# Patient Record
Sex: Female | Born: 1975 | Race: White | Hispanic: No | Marital: Married | Smoking: Never smoker
Health system: Southern US, Community
[De-identification: ages and names within clinical notes are randomized; demographics above are authoritative.]

## PROBLEM LIST (undated history)

## (undated) DIAGNOSIS — G40909 Epilepsy, unspecified, not intractable, without status epilepticus: Secondary | ICD-10-CM

## (undated) HISTORY — DX: Epilepsy, unspecified, not intractable, without status epilepticus: G40.909

---

## 2007-12-29 ENCOUNTER — Inpatient Hospital Stay (HOSPITAL_COMMUNITY): Admission: RE | Admit: 2007-12-29 | Discharge: 2007-12-31 | Payer: Self-pay | Admitting: Obstetrics and Gynecology

## 2008-01-02 ENCOUNTER — Encounter: Payer: Self-pay | Admitting: Obstetrics and Gynecology

## 2008-01-02 ENCOUNTER — Inpatient Hospital Stay (HOSPITAL_COMMUNITY): Admission: EM | Admit: 2008-01-02 | Discharge: 2008-01-03 | Payer: Self-pay | Admitting: Pediatrics

## 2008-05-30 ENCOUNTER — Ambulatory Visit (HOSPITAL_COMMUNITY): Admission: RE | Admit: 2008-05-30 | Discharge: 2008-05-30 | Payer: Self-pay | Admitting: Chiropractic Medicine

## 2009-12-14 ENCOUNTER — Inpatient Hospital Stay (HOSPITAL_COMMUNITY): Admission: AD | Admit: 2009-12-14 | Discharge: 2009-12-15 | Payer: Self-pay | Admitting: Obstetrics and Gynecology

## 2010-03-30 ENCOUNTER — Emergency Department (HOSPITAL_COMMUNITY): Admission: EM | Admit: 2010-03-30 | Discharge: 2010-03-31 | Payer: Self-pay | Admitting: Emergency Medicine

## 2010-04-08 ENCOUNTER — Encounter: Admission: RE | Admit: 2010-04-08 | Discharge: 2010-04-08 | Payer: Self-pay | Admitting: Family Medicine

## 2010-04-08 ENCOUNTER — Encounter: Payer: Self-pay | Admitting: Internal Medicine

## 2010-04-18 ENCOUNTER — Ambulatory Visit: Payer: Self-pay | Admitting: Internal Medicine

## 2010-04-18 DIAGNOSIS — R569 Unspecified convulsions: Secondary | ICD-10-CM | POA: Insufficient documentation

## 2010-04-18 DIAGNOSIS — R930 Abnormal findings on diagnostic imaging of skull and head, not elsewhere classified: Secondary | ICD-10-CM | POA: Insufficient documentation

## 2010-04-22 ENCOUNTER — Ambulatory Visit: Payer: Self-pay | Admitting: Internal Medicine

## 2010-05-16 ENCOUNTER — Encounter: Payer: Self-pay | Admitting: Internal Medicine

## 2010-06-07 ENCOUNTER — Ambulatory Visit: Payer: Self-pay | Admitting: Internal Medicine

## 2010-06-07 DIAGNOSIS — J129 Viral pneumonia, unspecified: Secondary | ICD-10-CM | POA: Insufficient documentation

## 2010-06-10 ENCOUNTER — Telehealth (INDEPENDENT_AMBULATORY_CARE_PROVIDER_SITE_OTHER): Payer: Self-pay | Admitting: *Deleted

## 2010-07-30 NOTE — Assessment & Plan Note (Signed)
Summary: New / Bcbs / # cd   Vital Signs:  Patient profile:   35 year old female Menstrual status:  regular LMP:     04/06/2010 Height:      63 inches Weight:      137.25 pounds BMI:     24.40 O2 Sat:      97 % on Room air Temp:     98.0 degrees F oral Pulse rate:   93 / minute Pulse rhythm:   regular Resp:     16 per minute BP sitting:   126 / 88  (left arm) Cuff size:   regular  Vitals Entered By: Rock Nephew CMA (April 18, 2010 1:18 PM)  O2 Flow:  Room air  Primary Care Tieisha Darden:  Etta Grandchild MD   History of Present Illness: New to me this [place] female had a CT scan done 0n 10/10 that is suspicious for sarcoid. She has been seeing Dr. Cleta Alberts for URI symptoms and was found to have an abnormal Chest xray and a CT was ordered that shows findings c/w sarcoid (see scanned document). She says that Dr. Cleta Alberts did blood work that was negative for sarcoid but that her WBC count was elevated and she was placed on antibiotics ( a copy of those labs is not available to me today). She still has some SOB and NP cough.  Preventive Screening-Counseling & Management  Alcohol-Tobacco     Alcohol drinks/day: 0     Smoking Status: never     Tobacco Counseling: not indicated; no tobacco use  Caffeine-Diet-Exercise     Does Patient Exercise: yes  Hep-HIV-STD-Contraception     Hepatitis Risk: no risk noted     HIV Risk: no risk noted     STD Risk: no risk noted      Sexual History:  currently monogamous.        Drug Use:  no.        Blood Transfusions:  no.    Current Medications (verified): 1)  Lamictal 100 Mg Tabs (Lamotrigine) .... 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 at [employer] Married Never Smoked Alcohol use-no Drug use-no Regular exercise-yes Smoking Status:  never Hepatitis Risk:  no risk noted HIV Risk:  no risk noted STD Risk:  no risk noted Sexual History:  currently monogamous Blood Transfusions:  no Drug Use:  no Does Patient Exercise:  yes  Review of Systems       The patient complains of prolonged cough.  The patient denies anorexia, fever, weight loss, weight gain, chest pain, syncope, dyspnea on exertion, peripheral edema, headaches, hemoptysis, abdominal pain, suspicious skin lesions, difficulty walking, depression, abnormal bleeding, enlarged lymph nodes, and angioedema.   General:  Denies chills, fatigue, fever, loss of appetite, malaise, sleep disorder, and sweats. Resp:  Complains of chest discomfort and cough; denies chest pain with inspiration, coughing up blood, hypersomnolence, morning headaches, pleuritic, shortness of breath, sputum productive, and wheezing. Derm:  Denies dryness, flushing, itching, lesion(s), and rash.  Physical Exam  General:  alert, well-developed, well-nourished, well-hydrated, appropriate dress, normal appearance, and healthy-appearing.   Head:  normocephalic, atraumatic, no abnormalities observed, and no abnormalities palpated.   Eyes:  vision grossly intact, pupils equal, pupils round, and pupils reactive to light.   Mouth:  Oral mucosa and oropharynx without lesions or exudates.  Teeth in good repair. Neck:  supple, full ROM, no masses, no thyromegaly, no thyroid nodules or tenderness, no JVD, normal carotid upstroke, no carotid bruits, no cervical lymphadenopathy, and no neck tenderness.   Lungs:  normal respiratory effort, no intercostal retractions, no accessory muscle use, normal breath sounds, no dullness, no fremitus, no crackles, and no wheezes.   Heart:   normal rate, regular rhythm, no murmur, no gallop, no rub, and no JVD.   Abdomen:  soft, non-tender, normal bowel sounds, no distention, no masses, no guarding, no rigidity, no rebound tenderness, no abdominal hernia, no inguinal hernia, no hepatomegaly, and no splenomegaly.   Msk:  No deformity or scoliosis noted of thoracic or lumbar spine.   Pulses:  R and L carotid,radial,femoral,dorsalis pedis and posterior tibial pulses are full and equal bilaterally Extremities:  No clubbing, cyanosis, edema, or deformity noted with normal full range of motion of all joints.   Neurologic:  No cranial nerve deficits noted. Station and gait are normal. Plantar reflexes are down-going bilaterally. DTRs are symmetrical throughout. Sensory, motor and coordinative functions appear intact. Skin:  turgor normal, color normal, no rashes, no suspicious lesions, no ecchymoses, no petechiae, no purpura, no ulcerations, and no edema.   Cervical Nodes:  no anterior cervical adenopathy and no posterior cervical adenopathy.   Axillary Nodes:  no R axillary adenopathy and no L axillary adenopathy.   Psych:  Cognition and judgment appear intact. Alert and cooperative with normal attention span and concentration. No apparent delusions, illusions, hallucinations   Impression & Recommendations:  Problem # 1:  ABNORMAL CHEST XRAY (ICD-793.1) Assessment New this looks like sarcoid but the report of elevated WBC count raises concerns about lymphoma so I think she will need to have a lung biopsy done Orders: Pulmonary Referral (Pulmonary)  Complete Medication List: 1)  Lamictal 100 Mg Tabs (Lamotrigine) .... 50mg  am and 100mg  evening 2)  Albuterol Inhal   Patient Instructions: 1)  Please schedule a follow-up appointment in 1 month. 2)  If you are having sex and you or your partner don't want a child, use contraception.   Orders Added: 1)  Pulmonary Referral [Pulmonary] 2)  New Patient Level III [99203]   Immunization  History:  Influenza Immunization History:    Influenza:  state fluvax nasal (02/28/2010)   Immunization History:  Influenza Immunization History:    Influenza:  State Fluvax Nasal (02/28/2010)  Preventive Care Screening  Pap Smear:    Date:  01/28/2010    Results:  normal

## 2010-07-30 NOTE — Letter (Signed)
Summary: Pulmonary/[facility]  Pulmonary/[facility]   Imported By: Sherian Rein 05/22/2010 10:26:31  _____________________________________________________________________  External Attachment:    Type:   Image     Comment:   External Document

## 2010-07-30 NOTE — Assessment & Plan Note (Signed)
Summary: ABNORMAL CXR ///KP   Primary Provider/Referring Provider:  Etta Grandchild MD  CC:  Pulmonary Consult-Dr. Iona Coach CXR/CT.Marland Kitchen  History of Present Illness: April 22, 2010- 34 yoF, originally from [place], seen at request of Dr Yetta Barre because of abnormal radiology. Never smoker had seen Dr Lucie Leather 2 years ago for rhinitis without asthma. 1 month ago acutely ill with flu-like syndrome, ear ache, sore throat then cough. Initially some fever. Saw Dr Cleta Alberts at [facility] and CXR showed prominent nodes. Rx'd antibiotic. Began noting substernal soreness and CT on 04/08/10 showed mild mediastinal and hilar adenopathy with some scattered ground glass haziness. Radiologist suggested possible Sarcoid. Outside labs not yet available indicated some leukocytosis. ACE Neg. Mild dyspnea at most, w/ rash, hot joints or palpable adenopathy. No prior CXR. The cough and sore throat are now gone. Substernal soreness began w/ the cough and is now a little better, but fluctuates.  Denies prior hx of pneumonia, asthma or TB exposure.  Past hx seizures- once at age 13 and twice in 2009 after pregnancy.   Preventive Screening-Counseling & Management  Alcohol-Tobacco     Smoking Status: never  Current Medications (verified): 1)  Lamictal 100 Mg Tabs (Lamotrigine) .... 50mg  Two Times A Day 2)  Proair Hfa 108 (90 Base) Mcg/act Aers (Albuterol Sulfate) .... 2 Puffs Four Times A Day As Needed 3)  Advil 200 Mg Tabs (Ibuprofen) .... 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  Smoked Alcohol use-no Drug use-no Regular exercise-yes  Risk Factors: Alcohol Use: 0 (04/18/2010) Exercise: yes (04/18/2010)  Risk Factors: Smoking Status: never (04/22/2010)  Past Medical History: Seizure disorder in 2009 -post partum, she says her brain MRI was normal and no recent seizures ([place]) Abnormal Chest CT- 2011  Family History: Family History High cholesterol Family History Hypertension Family History of Stroke F 1st degree relative <60 Parents living  Social History: 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  The patient denies productive cough, coughing up blood, irregular heartbeats, acid heartburn, indigestion, loss of appetite, abdominal pain, difficulty swallowing, tooth/dental problems, headaches, nasal congestion/difficulty breathing through nose, sneezing, itching, ear ache, depression, hand/feet swelling, joint stiffness or pain, rash, change in color of mucus, and fever.    Vital Signs:  Patient profile:   35 year old female Menstrual status:  regular Height:      63 inches Weight:      136.25 pounds BMI:     24.22 O2 Sat:      98 % on Room air Pulse rate:   115 / minute BP sitting:   130 / 84  (left arm) Cuff size:   regular  Vitals Entered By: Reynaldo Minium CMA (April 22, 2010 1:35 PM)  O2 Flow:  Room air  Physical Exam  Additional Exam:  General: A/Ox3; pleasant and cooperative, NAD, wdwn SKIN: no rash, lesions NODES: no  lymphadenopathy HEENT: [place]/AT, EOM- WNL, Conjuctivae- clear, PERRLA, TM-WNL, Nose- clear, Throat- clear and wnl, Mallampati  II NECK: Supple w/ fair ROM, JVD- none, normal carotid impulses w/o bruits Thyroid- normal to  palpation CHEST: Clear to P&A HEART: RRR, no m/g/r heard ABDOMEN: Soft and nl; nml bowel sounds; no organomegaly or masses noted. ZOX:WRUE, nl pulses, no edema  NEURO: Grossly intact to observation      Impression & Recommendations:  Problem # 1:  ABNORMAL CHEST XRAY (ICD-793.1)  3 weeks of substernal chest discomfort, mediastinal adenopathy, ground glass w/ a bronchitis syndrome. My first thought would be a postviral bronchitis w/ reactive adenopathy, because it began with fever, sore throat and earache, then cough. . DDX is next sarcoid, which sometimes seems to start with an inflammatory trigger.  Then less likely lymphoproliferative disease/ lymphoma.  I suggested we treat for comfort for 3 weeks, then get her back for CXR and labs. If this were an acute inflammatory response I would expect to see improvement  by then. Otherwise we will need to biopsy. I also wanted time for outside labs that reportedly have arrived downstairs, to be scanned in.   Medications Added to Medication List This Visit: 1)  Lamictal 100 Mg Tabs (Lamotrigine) .... 50mg  two times a day 2)  Proair Hfa 108 (90 Base) Mcg/act Aers (Albuterol sulfate) .... 2 puffs four times a day as needed 3)  Advil 200 Mg Tabs (Ibuprofen) .... Take 2 by mouth two times a day  Other Orders: Consultation Level IV (45409)  Patient Instructions: 1)  Please schedule a follow-up appointment in 3 weeks. 2)  Please call sooner if you need.  3)  cc Dr Yetta Barre, Dr Cleta Alberts

## 2010-08-01 NOTE — Progress Notes (Signed)
Summary: results  Phone Note Call from Patient Call back at Home Phone [phone]   Caller: Patient Call For: young Summary of Call: pt wants results of cxr.  Initial call taken by: Tivis Ringer, CNA,  June 10, 2010 9:58 AM  Follow-up for Phone Call        Spoke with pt aware cxr normal per Dr.Young note. Follow-up by: Jerolyn Shin,  June 10, 2010 11:54 AM

## 2010-08-01 NOTE — Assessment & Plan Note (Signed)
Summary: rov/jd   Primary Provider/Referring Provider:  Etta Grandchild MD  CC:  Follow up visit-tightness in chest getting better but not back to normal..  History of Present Illness: CC:  Pulmonary Consult-Dr. Iona Coach CXR/CT.Marland Kitchen  History of Present Illness: April 22, 2010- 35 yoF, originally from [place], seen at request of Dr Yetta Barre because of abnormal radiology. Never smoker had seen Dr Lucie Leather 2 years ago for rhinitis without asthma. 1 month ago acutely ill with flu-like syndrome, ear ache, sore throat then cough. Initially some fever. Saw Dr Cleta Alberts at [facility] and CXR showed prominent nodes. Rx'd antibiotic. Began noting substernal soreness and CT on 04/08/10 showed mild mediastinal and hilar adenopathy with some scattered ground glass haziness. Radiologist suggested possible Sarcoid. Outside labs not yet available indicated some leukocytosis. ACE Neg. Mild dyspnea at most, w/ rash, hot joints or palpable adenopathy. No prior CXR. The cough and sore throat are now gone. Substernal soreness began w/ the cough and is now a little better, but fluctuates.  Denies prior hx of pneumonia, asthma or TB exposure.  Past hx seizures- once at age 27 and twice in 2009 after pregnancy.   June 07, 2010- Pneumonia with adenopathy. Nurse-CC: CC: Follow up visit-tightness in chest getting better but not back to normal. "Better" means less aware of chest tightness associated with exertion or wet/ cold weather. Gradually better each week. Denies cough or wheeze, night swets or nodes. .  CT had originally shown ground glass and adenopathy surggestive of sarcoid.    Preventive Screening-Counseling & Management  Alcohol-Tobacco     Alcohol drinks/day: 0     Smoking Status: never     Tobacco Counseling: not indicated; no tobacco use  Current Medications (verified): 1)  Proair Hfa 108 (90 Base) Mcg/act Aers (Albuterol Sulfate) .... 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[employer], 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    Vital Signs:  Patient profile:   35 year old female Menstrual status:  regular Height:      63 inches Weight:      138.25 pounds BMI:     24.58 O2 Sat:      100 % on Room air Pulse rate:   95 / minute BP sitting:   120 / 74  (left arm) Cuff size:   regular  Vitals Entered By: Reynaldo Minium CMA (June 07, 2010 3:30 PM)  O2 Flow:  Room air CC: Follow up visit-tightness in chest getting better but not back to normal.   Physical  Exam  Additional Exam:  General: A/Ox3; pleasant and cooperative, NAD, wdwn SKIN: no rash, lesions NODES: no lymphadenopathy HEENT: [place]/AT, EOM- WNL, Conjuctivae- clear, PERRLA, TM-WNL, Nose- clear, Throat- clear and wnl, Mallampati  II NECK: Supple w/ fair ROM, JVD-  none, normal carotid impulses w/o bruits Thyroid- normal to palpation CHEST: Clear to P&A HEART: RRR, no m/g/r heard ABDOMEN: Not obese ZOX:WRUE, nl pulses, no edema  NEURO: Grossly intact to observation      Impression & Recommendations:  Problem # 1:  ABNORMAL CHEST XRAY (ICD-793.1)  Symptomatically she is improved and I think the course is consistent with impression that she has had a viral pattern pneumonia. We will recheck CXR, and if improving to support her impression, we should be out of the woods.   Other Orders: Est. Patient Level IV (99214) T-2 View CXR (71020TC)  Patient Instructions: 1)  Please schedule a follow-up appointment as needed. 2)  A chest x-ray has been recommended.  Your imaging study may require preauthorization.

## 2010-09-15 LAB — CBC
HCT: 35.2 % — ABNORMAL LOW (ref 36.0–46.0)
Hemoglobin: 12.2 g/dL (ref 12.0–15.0)
Hemoglobin: 13.1 g/dL (ref 12.0–15.0)
MCHC: 34.6 g/dL (ref 30.0–36.0)
MCV: 96.5 fL (ref 78.0–100.0)
MCV: 97.4 fL (ref 78.0–100.0)
Platelets: 191 10*3/uL (ref 150–400)
RBC: 3.93 MIL/uL (ref 3.87–5.11)
RDW: 13.6 % (ref 11.5–15.5)

## 2010-11-12 NOTE — Discharge Summary (Signed)
Michele Mills, Michele Mills               ACCOUNT NO.:  [id number]   MEDICAL RECORD NO.:  [id number]          PATIENT TYPE:  INP   LOCATION:  9110                          FACILITY:  [facility]   PHYSICIAN:  Sherron Monday, MD        DATE OF BIRTH:  03/06/1976   DATE OF ADMISSION:  12/29/2007  DATE OF DISCHARGE:  12/31/2007                               DISCHARGE SUMMARY   ADMITTING DIAGNOSIS:  Intrauterine pregnancy at term with IUGR with an  estimated fetal weight of less than 10th percentile.   DISCHARGE DIAGNOSIS:  Intrauterine pregnancy at term IUGR with an  estimated fetal weight of less than 10th percentile, delivered via  spontaneous vaginal delivery.   HISTORY OF PRESENT ILLNESS:  A 35 year old G1 P0, at 56 plus weeks.  EDC  of January 04, 2008 by last menstrual period consistent with 7-week  ultrasound presents to L&D for induction given on ultrasound on December 27, 2007 showing IUGR with an estimated fetal weight of less than 10th  percentile with normal AFI and a reactive indices.  Prenatal care is  otherwise uncomplicated.   PAST MEDICAL HISTORY:  Nonsignificant.   PAST SURGICAL HISTORY:  Nonsignificant.   PAST OB/GYN HISTORY:  She is a G1 with the present pregnancy with a  history of an abnormal Pap smear, which has resolved.   MEDICATION:  Include prenatal vitamins.   ALLERGIES:  No known drug allergies.   SOCIAL HISTORY:  Denies alcohol, tobacco, or drug use.  She is married.   PRENATAL LABS:  A positive.  MSU negative.  Rubella immune.  Hepatitis C  surface antigen negative.  HIV negative.  Gonorrhea negative.  Chlamydia  negative.  Group B strep negative.  First-hour screen within limits.  A  1-hour Glucola is 72.   PHYSICAL EXAMINATION:  On admission, she is afebrile.  Vital signs  stable.  Fetal heart rate tones are reactive with occasional contractions and a  benign exam.  Her cervix is found to be 1 to 1+ cm dilated, 50% effaced,  -2 station.   On admission, she had an  AROM performed for clear fluid and started on  Pitocin for induction to monitor closely throughout her labor progress  and IUPC was placed.  She labored down and got uncomfortable.  Pushed  well from complete, complete, +2, for approximately 10 minutes with a  normal delivery of a vigorous female over a small first-degree  laceration with Apgars of 8 at 1 minutes and 9 at 5 minutes and total  weight of 6 pounds 8 ounces.  Placenta was delivered intact  spontaneously.  A small laceration was repaired with 2-0 Vicryl for  hemostasis.  Postpartum course was relatively uncomplicated.  She  remained afebrile.  Vital signs stable throughout.  Complained of some  soreness, which was within normal limits.  She was discharged to home.  Postpartum day #2  with routine discharge instructions and numbers to call if any questions  or problems as well as prescription for Motrin, Vicodin, and prenatal  vitamins.  She plans to bottle feed and will  follow up in 6 weeks to  discuss contraception.  Her hemoglobin decreased from 14.1 to 12.3.      Sherron Monday, MD  Electronically Signed     JB/MEDQ  D:  12/31/2007  T:  12/31/2007  Job:  [id number]

## 2010-11-12 NOTE — Discharge Summary (Signed)
NAMENOMI, RUDNICKI NO.:  [id number]   MEDICAL RECORD NO.:  [id number]          PATIENT TYPE:  INP   LOCATION:  3008                         FACILITY:  [facility]   PHYSICIAN:  Deanna Artis. Hickling, M.D.DATE OF BIRTH:  [birth year]   DATE OF ADMISSION:  01/02/2008  DATE OF DISCHARGE:  01/03/2008                               DISCHARGE SUMMARY   FINAL DIAGNOSES:  New onset of seizures, 345.10.   PROCEDURES:  CT scan of the brain, MRI scan of the brain, and EEG.   COMPLICATIONS:  None.   SUMMARY OF THE HOSPITALIZATION:  Ms. Scarbro is a 35 year old  primigravida, now 5 days postpartum, who had onset of 2 seizures in the  early morning hours of January 02, 2008.  These are generalized in nature  without focal onset and with postictal changes.   The patient was admitted to [facility] for evaluation and  treatment.   We loaded her with Cerebyx and treated with maintenance Dilantin.  We  also started Lamictal with the intent to crossover to this drug because  of its safety profile for women with  childbearing years.   The MRI scan of the brain without and with contrast was normal.  EEG was  normal.  The patient's laboratory studies failed to show any signs of  changes related to toxemia.   PHYSICAL EXAMINATION:  On examination this morning, temperature 98.0,  blood pressure 126/84, resting pulse 74, and oxygen saturation 96%.  General physical examination is normal.  Neurological examination is  normal and is recorded on the chart.   PLAN:  1. The patient will be discharged home at this time.  She will be sent      home on Dilantin 300 mg nightly for 6 weeks time.  This will be      discontinued at the end of that time.  2. The patient will start on Lamictal.  We have discussed lamotrigine      versus trial of Lamictal and the family has decided to go with the      latter.  She will start on 25 mg twice daily for 2 weeks, 50 mg      twice daily for 2 weeks, 75  mg twice daily for 1 week, and then      switch over to 100 mg tablets from 25 mg tablets and take 1 twice      daily.  Prescriptions have been issued for Dilantin 100 mg tablets,      #126; Lamictal 25 mg tablets, #126; and Lamictal 100 mg tablets,      #62, only the latter with 5 refills.   The patient will have CBC with diff checked in 2 weeks, 4 weeks, and 6  weeks and a trial of lamotrigine level in 7 weeks' time.  We will  titrate her lamotrigine level from there and observe her response to  antiepileptic medication.   The patient is not to drive until she has been seizure-free for 6  months.  The family understands the importance of this.  I have  discussed benefits  and side effects of the medications, and the need for  medical compliance.  Plans for follow-up in October 2009.  No further  workup is needed at this time.  The patient has tolerated the medicines  well, and is discharged in improved condition.      Deanna Artis. Sharene Skeans, M.D.  Electronically Signed     [facility]/MEDQ  D:  01/03/2008  T:  01/04/2008  Job:  [id number]

## 2010-11-12 NOTE — H&P (Signed)
Michele Mills, BARDSLEY NO.:  [id number]   MEDICAL RECORD NO.:  [id number]          PATIENT TYPE:  INP   LOCATION:  3008                         FACILITY:  [facility]   PHYSICIAN:  Deanna Artis. Hickling, M.D.DATE OF BIRTH:  09/07/1975   DATE OF ADMISSION:  01/02/2008  DATE OF DISCHARGE:                              HISTORY & PHYSICAL   CHIEF COMPLAINT:  Recurrent seizures.   HISTORY OF PRESENT ILLNESS:  A 35 year old right-handed, G1, P1 woman  now 4 days postpartum without complicated pregnancy.   The patient had onset around 2:30 this morning of a 3 to 7-minute  generalized tonic-clonic seizure with cyanosis and brief apnea and a 40-  minute postictal.  The patient was transferred to [facility] after  EMS evaluated her for assessment.  She had recurrent 2-minute  generalized tonic-clonic seizure with duskiness and a 40-minute  postictal period around 6:50 a.m.  The patient is to be admitted for  diagnostic workup, initiation of treatment, and observation.   The patient was loaded with Cerebyx 1200 mg phenytoin equivalents  (estimated weight 150 pounds).  She feels trag, but otherwise is fine.   PAST MEDICAL HISTORY:  Single seizure in [place] at age 29.  She said  she was very hot and dehydrated.  I lost 2 days.  She awakened in the  hospital.  Workup by the patient's report was negative.  She was not  placed on medications.   The only other medical problem that she had was abnormal Pap smear,  which apparently has normalized.   PAST SURGICAL HISTORY:  None.   REVIEW OF SYSTEMS:  The patient complains of tenderness in the axillary  regions bilaterally in all cerebral.  I believe this is related to  engorgement.  She has some postpartum perineal pain, fever, nausea,  vomiting, diarrhea, cough, rash, no other organ systems symptoms in a 12-  system review.   MEDICATIONS:  1. Dilantin, she will receive 300 mg at bedtime as a maintenance dose.  2. Lamictal  25 mg twice a day will be started and will be gradually      introduced over the next 6 weeks.  3. She will receive ibuprofen 800 mg for pain every 6 hours as needed.  4. Prenatal vitamins.   DRUG ALLERGIES:  None known.   FAMILY HISTORY:  Mother had 2 strokes in her 84s, has hypertension, and  had anniversary seizures.   The paternal grandmother had cancer of the stomach.  No other history of  seizures, mental retardation, cerebral palsy, blindness, or deafness.   PHYSICAL EXAMINATION:  VITAL SIGNS:  Blood pressure 105/70, resting  pulse 70, temperature 96, and oxygen saturation 97%.  HEENT:  Ears, nose, and throat; no infections, no bruits, or bitten  tongue.  LUNGS:  Clear.  HEART:  No murmurs.  Pulses are normal.  ABDOMEN:  Soft.  Bowel sounds are normal.  No hepatosplenomegaly.  EXTREMITIES:  Unremarkable.  NEUROLOGIC:  Mental status; awake and alert without dysphasia.  Cranial  nerves are round and reactive pupils.  Visual fields full to double  simultaneous stimuli.  Symmetric facial strength.  Midline tongue and  uvula.  Air conduction greater than bone conduction bilaterally.  MOTOR:  Normal strength, tone, and mass.  Good fine motor movements.  No  pronator drift.  Sensation intact to primary and cortical modalities.  CEREBELLAR:  Good finger-to-nose, rapid alternating movements.  Gait was  not tested.  Deep tendon reflexes were brisk without clonus.  She had  bilateral flexor plantar responses.   IMPRESSION:  1. Recurrent seizures 345.10.  I do not know if these are partial      onset that there seems to be no partial onset that we can see.  2. The patient is recently postpartum.   PLAN:  An EEG on January 03, 2008.  MRI of the brain without with contrast,  today or on January 03, 2008, dilantin and Lamictal treatment as noted  above.   I have reviewed her CT scan, which shows a large cisterna magna, mild  vermian hypoplasia, ventricles at top limits are normal and  normal  cortex.   LABORATORY STUDIES:  White blood cell count 11,400, hemoglobin 12.4,  hematocrit 36.2, MCV 95.1, and platelet count 280,000.  Sodium 136,  potassium 3.6, chloride 102, carbon dioxide 26, glucose 94, BUN 8,  creatinine 0.56, calcium 8.6, total protein 5.7, and albumin 2.6.  AST  29, LDH 198, and uric acid 4.6.  ALT 35, alkaline phosphatase 70, total  bilirubin 0.5, GFR greater than 60.  Urinalysis; specific gravity 1.015,  pH 7.0.  Negative chemistries and negative microscopic.      Deanna Artis. Sharene Skeans, M.D.  Electronically Signed     [facility]  D:  01/02/2008  T:  01/02/2008  Job:  [id number]   cc:   Sherron Monday, MD

## 2010-11-12 NOTE — Procedures (Signed)
CLINICAL HISTORY:  The patient is a 35 year old who is 4 days of  delivery of a 39-week gestational age infant without complications.  The  patient had two episode of seizures on the morning of January 02, 2008.  She  had a previous seizure 10 years ago.  Study is being done to look for  presence of seizure disorder (780.39).   PROCEDURE:  Tracing is carried out on a 32-channel digital Cadwell  recorder reformatted into 16 channel montages with one devoted to EKG.  The patient was awake and drowsy during the recording.  Medications  include Dilantin, Lamictal, and ibuprofen.  The [employer] 10/20  system lead placement was used.   DESCRIPTION OF FINDINGS:  Dominant frequency is a 15 microvolt 9-10 Hz  activity that is well regulated.  Background activity is predominantly  alpha and beta range activity.  Occasional upper theta range components  were seen.  Toward the end of the activity, the patient becomes drowsy  with mixed frequency theta upper delta range activity and briefly drifts  into natural sleep with vertex sharp waves.   Activating procedures with hyperventilation caused little change in  background.  Photic stimulation induced a driving response from 5 to 11  Hz.   There was no interictal epileptiform activity in the form of spikes or  sharp waves.   EKG showed regular sinus rhythm with ventricular response of 78 beats  per minute.   IMPRESSION:  Normal record with the patient awake, drowsy, and asleep.      Deanna Artis. Sharene Skeans, M.D.  Electronically Signed     [facility]:WCBJ  D:  01/03/2008 15:08:56  T:  01/04/2008 62:83:15  Job #:  [id number]

## 2011-03-27 LAB — COMPREHENSIVE METABOLIC PANEL
ALT: 35
AST: 29
Albumin: 2.6 — ABNORMAL LOW
Alkaline Phosphatase: 70
CO2: 26
Chloride: 102
Creatinine, Ser: 0.56
GFR calc Af Amer: >60
GFR calc non Af Amer: >60
Potassium: 3.6
Sodium: 136
Total Bilirubin: 0.5

## 2011-03-27 LAB — RAPID URINE DRUG SCREEN, HOSP PERFORMED
Amphetamines: NOT DETECTED
Barbiturates: NOT DETECTED
Benzodiazepines: NOT DETECTED
Opiates: NOT DETECTED

## 2011-03-27 LAB — URINALYSIS, ROUTINE W REFLEX MICROSCOPIC
Bilirubin Urine: NEGATIVE
Glucose, UA: NEGATIVE
Ketones, ur: NEGATIVE
Leukocytes, UA: NEGATIVE
Protein, ur: NEGATIVE
pH: 7

## 2011-03-27 LAB — CBC
HCT: 41
MCHC: 34.2
MCHC: 34.4
MCV: 95.1
Platelets: 246
Platelets: 280
Platelets: 287
RBC: 3.74 — ABNORMAL LOW
RBC: 3.81 — ABNORMAL LOW
RDW: 13.6
WBC: 11.4 — ABNORMAL HIGH

## 2011-03-27 LAB — URINE MICROSCOPIC-ADD ON

## 2011-04-28 ENCOUNTER — Ambulatory Visit (INDEPENDENT_AMBULATORY_CARE_PROVIDER_SITE_OTHER): Payer: Federal, State, Local not specified - PPO | Admitting: Internal Medicine

## 2011-04-28 ENCOUNTER — Encounter: Payer: Self-pay | Admitting: Internal Medicine

## 2011-04-28 VITALS — BP 122/82 | HR 91 | Temp 98.1°F

## 2011-04-28 DIAGNOSIS — H6690 Otitis media, unspecified, unspecified ear: Secondary | ICD-10-CM

## 2011-04-28 MED ORDER — AMOXICILLIN-POT CLAVULANATE 875-125 MG PO TABS: 1.0000 | ORAL_TABLET | Freq: Two times a day (BID) | ORAL | Status: AC

## 2011-04-28 NOTE — Patient Instructions (Signed)
It was good to see you today. Augmentin antibiotics for your ear infection- Your prescription(s) have been submitted to your pharmacy. Please take as directed and contact our office if you believe you are having problem(s) with the medication(s). Continue ibuprofen for pain as ongoing

## 2011-04-28 NOTE — Progress Notes (Signed)
  Subjective:    Patient ID: Michele Mills, female    DOB: 11-Dec-1975, 35 y.o.   MRN: [id number]  Otalgia  There is pain in the left ear. This is a new problem. The current episode started 1 to 4 weeks ago. The problem occurs constantly. The problem has been gradually worsening. The maximum temperature recorded prior to her arrival was 100 - 100.9 F. The fever has been present for 1 to 2 days. The pain is mild. Pertinent negatives include no ear discharge, headaches, hearing loss or neck pain. The treatment provided moderate relief.   Past Medical History  Diagnosis Date  . Seizure disorder     Review of Systems  HENT: Positive for ear pain. Negative for hearing loss, neck pain and ear discharge.   Neurological: Negative for headaches.       Objective:   Physical Exam BP 122/82  Pulse 91  Temp(Src) 98.1 F (36.7 C) (Oral)  SpO2 99% Constitutional: She appears well-developed and well-nourished. No distress.  HENT: Head: Normocephalic and atraumatic. Ears: R TMs ok, no erythema or effusion; L TM with erythema and hazy effusion. Nose: Nose normal.  Mouth/Throat: Oropharynx is clear and moist. No oropharyngeal exudate.  Neck: Normal range of motion. Neck supple. No JVD present. No thyromegaly present.  Cardiovascular: Normal rate, regular rhythm and normal heart sounds.  No murmur heard. No BLE edema. Pulmonary/Chest: Effort normal and breath sounds normal. No respiratory distress. She has no wheezes. Psychiatric: She has a normal mood and affect. Her behavior is normal. Judgment and thought content normal.   Lab Results  Component Value Date   WBC 14.4* 12/15/2009   HGB 12.2 12/15/2009   HCT 35.2* 12/15/2009   PLT 191 12/15/2009   GLUCOSE 94 01/02/2008   ALT 35 01/02/2008   AST 29 01/02/2008   NA 136 01/02/2008   K 3.6 01/02/2008   CL 102 01/02/2008   CREATININE 0.56 01/02/2008   BUN 8 01/02/2008   CO2 26 01/02/2008       Assessment & Plan:  L otitis media, sx ongoing > 2 weeks with  increasing pain and LGF at home - augmentin antibiotics  + continue nasal steroids + NSAIDs

## 2011-06-25 ENCOUNTER — Ambulatory Visit (INDEPENDENT_AMBULATORY_CARE_PROVIDER_SITE_OTHER): Payer: Federal, State, Local not specified - PPO | Admitting: Internal Medicine

## 2011-06-25 ENCOUNTER — Encounter: Payer: Self-pay | Admitting: Internal Medicine

## 2011-06-25 ENCOUNTER — Ambulatory Visit (INDEPENDENT_AMBULATORY_CARE_PROVIDER_SITE_OTHER)
Admission: RE | Admit: 2011-06-25 | Discharge: 2011-06-25 | Disposition: A | Discharge status: Home / Self Care | Payer: Federal, State, Local not specified - PPO | Source: Ambulatory Visit | Attending: Internal Medicine | Admitting: Internal Medicine

## 2011-06-25 VITALS — BP 116/80 | HR 84 | Temp 98.4°F | Resp 16 | Wt 130.0 lb

## 2011-06-25 DIAGNOSIS — J209 Acute bronchitis, unspecified: Secondary | ICD-10-CM

## 2011-06-25 DIAGNOSIS — H6690 Otitis media, unspecified, unspecified ear: Secondary | ICD-10-CM

## 2011-06-25 DIAGNOSIS — R05 Cough: Secondary | ICD-10-CM

## 2011-06-25 DIAGNOSIS — R059 Cough, unspecified: Secondary | ICD-10-CM

## 2011-06-25 MED ORDER — PSEUDOEPH-CHLORPHEN-HYDROCOD 60-4-5 MG/5ML PO SOLN: 5.0000 mL | Freq: Four times a day (QID) | ORAL | Status: DC | PRN

## 2011-06-25 MED ORDER — AMOXICILLIN-POT CLAVULANATE 500-125 MG PO TABS: 1.0000 | ORAL_TABLET | Freq: Three times a day (TID) | ORAL | Status: AC

## 2011-06-25 NOTE — Assessment & Plan Note (Signed)
Start augmentin and she was given pt ed material as well, I think the hydrocodone will help with the pain

## 2011-06-25 NOTE — Patient Instructions (Signed)
Acute Bronchitis You have acute bronchitis. This means you have a chest cold. The airways in your lungs are red and sore (inflamed). Acute means it is sudden onset.  CAUSES Bronchitis is most often caused by the same virus that causes a cold. SYMPTOMS   Body aches.   Chest congestion.   Chills.   Cough.   Fever.   Shortness of breath.   Sore throat.  TREATMENT  Acute bronchitis is usually treated with rest, fluids, and medicines for relief of fever or cough. Most symptoms should go away after a few days or a week. Increased fluids may help thin your secretions and will prevent dehydration. Your caregiver may give you an inhaler to improve your symptoms. The inhaler reduces shortness of breath and helps control cough. You can take over-the-counter pain relievers or cough medicine to decrease coughing, pain, or fever. A cool-air vaporizer may help thin bronchial secretions and make it easier to clear your chest. Antibiotics are usually not needed but can be prescribed if you smoke, are seriously ill, have chronic lung problems, are elderly, or you are at higher risk for developing complications.Allergies and asthma can make bronchitis worse. Repeated episodes of bronchitis may cause longstanding lung problems. Avoid smoking and secondhand smoke.Exposure to cigarette smoke or irritating chemicals will make bronchitis worse. If you are a cigarette smoker, consider using nicotine gum or skin patches to help control withdrawal symptoms. Quitting smoking will help your lungs heal faster. Recovery from bronchitis is often slow, but you should start feeling better after 2 to 3 days. Cough from bronchitis frequently lasts for 3 to 4 weeks. To prevent another bout of acute bronchitis:  Quit smoking.   Wash your hands frequently to get rid of viruses or use a hand sanitizer.   Avoid other people with cold or virus symptoms.   Try not to touch your hands to your mouth, nose, or eyes.  SEEK  IMMEDIATE MEDICAL CARE IF:  You develop increased fever, chills, or chest pain.   You have severe shortness of breath or bloody sputum.   You develop dehydration, fainting, repeated vomiting, or a severe headache.   You have no improvement after 1 week of treatment or you get worse.  MAKE SURE YOU:   Understand these instructions.   Will watch your condition.   Will get help right away if you are not doing well or get worse.  Document Released: 07/24/2004 Document Revised: 02/26/2011 Document Reviewed: 10/09/2010 [facility] Patient Information 2012 [place], [place].Otitis Media, Adult A middle ear infection is an infection in the space behind the eardrum. The medical name for this is "otitis media." It may happen after a common cold. It is caused by a germ that starts growing in that space. You may feel swollen glands in your neck on the side of the ear infection. HOME CARE INSTRUCTIONS   Take your medicine as directed until it is gone, even if you feel better after the first few days.   Only take over-the-counter or prescription medicines for pain, discomfort, or fever as directed by your caregiver.   Occasional use of a nasal decongestant a couple times per day may help with discomfort and help the eustachian tube to drain better.  Follow up with your caregiver in 10 to 14 days or as directed, to be certain that the infection has cleared. Not keeping the appointment could result in a chronic or permanent injury, pain, hearing loss and disability. If there is any problem keeping the appointment,  you must call back to this facility for assistance. SEEK IMMEDIATE MEDICAL CARE IF:   You are not getting better in 2 to 3 days.   You have pain that is not controlled with medication.   You feel worse instead of better.   You cannot use the medication as directed.   You develop swelling, redness or pain around the ear or stiffness in your neck.  MAKE SURE YOU:   Understand these  instructions.   Will watch your condition.   Will get help right away if you are not doing well or get worse.  Document Released: 03/21/2004 Document Revised: 02/26/2011 Document Reviewed: 01/21/2008 [facility] Patient Information 2012 [place], [place].

## 2011-06-25 NOTE — Assessment & Plan Note (Signed)
I will check her CXR today to see if she has pna, mass, edema, etc

## 2011-06-25 NOTE — Assessment & Plan Note (Signed)
Start augmentin for the infection and a cough suppressant

## 2011-06-25 NOTE — Progress Notes (Signed)
Subjective:    Patient ID: Michele Mills, female    DOB: [birth year], 35 y.o.   MRN: [id number]  Cough This is a new problem. Episode onset: for 5 days. The problem has been unchanged. The problem occurs every few hours. The cough is productive of purulent sputum. Associated symptoms include chills, ear pain (left) and a sore throat. Pertinent negatives include no chest pain, ear congestion, fever, headaches, heartburn, hemoptysis, myalgias, nasal congestion, postnasal drip, rash, rhinorrhea, shortness of breath, sweats, weight loss or wheezing. The symptoms are aggravated by nothing. She has tried OTC cough suppressant for the symptoms. The treatment provided no relief. Her past medical history is significant for pneumonia.      Review of Systems  Constitutional: Positive for chills and fatigue. Negative for fever, weight loss, diaphoresis, activity change, appetite change and unexpected weight change.  HENT: Positive for ear pain (left) and sore throat. Negative for hearing loss, nosebleeds, congestion, facial swelling, rhinorrhea, sneezing, neck pain, neck stiffness, voice change, postnasal drip, sinus pressure, tinnitus and ear discharge.   Eyes: Negative.   Respiratory: Positive for cough. Negative for hemoptysis, chest tightness, shortness of breath and wheezing.   Cardiovascular: Negative for chest pain, palpitations and leg swelling.  Gastrointestinal: Negative for heartburn, nausea, vomiting, abdominal pain, diarrhea, constipation, blood in stool and abdominal distention.  Musculoskeletal: Negative for myalgias, back pain, joint swelling, arthralgias and gait problem.  Skin: Negative for rash.  Neurological: Negative.  Negative for headaches.  Hematological: Negative for adenopathy. Does not bruise/bleed easily.  Psychiatric/Behavioral: Negative.        Objective:   Physical Exam  Vitals reviewed. Constitutional: She is oriented to person, place, and time. She appears  well-developed and well-nourished. No distress.  HENT:  Head: No trismus in the jaw.  Right Ear: Hearing, tympanic membrane, external ear and ear canal normal.  Left Ear: Hearing, external ear and ear canal normal. No swelling or tenderness. Tympanic membrane is injected, erythematous and retracted. Tympanic membrane is not scarred, not perforated and not bulging. Tympanic membrane mobility is normal.  No middle ear effusion.  Nose: Nose normal. No mucosal edema, rhinorrhea, nose lacerations, sinus tenderness, nasal deformity, septal deviation or nasal septal hematoma. No epistaxis.  No foreign bodies. Right sinus exhibits no maxillary sinus tenderness and no frontal sinus tenderness. Left sinus exhibits no maxillary sinus tenderness and no frontal sinus tenderness.  Mouth/Throat: Oropharynx is clear and moist and mucous membranes are normal. Mucous membranes are not pale, not dry and not cyanotic. No uvula swelling. No oropharyngeal exudate, posterior oropharyngeal edema, posterior oropharyngeal erythema or tonsillar abscesses.  Eyes: Conjunctivae are normal. Right eye exhibits no discharge. Left eye exhibits no discharge. No scleral icterus.  Neck: Normal range of motion. Neck supple. No JVD present. No tracheal deviation present. No thyromegaly present.  Cardiovascular: Normal rate, regular rhythm, normal heart sounds and intact distal pulses.  Exam reveals no gallop and no friction rub.   No murmur heard. Pulmonary/Chest: Effort normal and breath sounds normal. No stridor. No respiratory distress. She has no wheezes. She has no rales. She exhibits no tenderness.  Abdominal: Soft. Bowel sounds are normal. She exhibits no distension. There is no tenderness. There is no rebound and no guarding.  Musculoskeletal: Normal range of motion. She exhibits no edema and no tenderness.  Lymphadenopathy:    She has no cervical adenopathy.  Neurological: She is oriented to person, place, and time.  Skin: Skin  is warm and dry. No rash noted. She is  not diaphoretic. No erythema. No pallor.  Psychiatric: She has a normal mood and affect. Her behavior is normal. Judgment and thought content normal.          Assessment & Plan:

## 2011-07-08 ENCOUNTER — Ambulatory Visit (INDEPENDENT_AMBULATORY_CARE_PROVIDER_SITE_OTHER): Payer: Federal, State, Local not specified - PPO | Admitting: Internal Medicine

## 2011-07-08 ENCOUNTER — Encounter: Payer: Self-pay | Admitting: Internal Medicine

## 2011-07-08 ENCOUNTER — Other Ambulatory Visit: Payer: Federal, State, Local not specified - PPO

## 2011-07-08 VITALS — BP 114/76 | HR 90 | Temp 97.5°F | Resp 16

## 2011-07-08 DIAGNOSIS — Z23 Encounter for immunization: Secondary | ICD-10-CM

## 2011-07-08 DIAGNOSIS — A09 Infectious gastroenteritis and colitis, unspecified: Secondary | ICD-10-CM | POA: Insufficient documentation

## 2011-07-08 MED ORDER — METRONIDAZOLE 250 MG PO TABS: 250.0000 mg | ORAL_TABLET | Freq: Three times a day (TID) | ORAL | Status: AC

## 2011-07-08 NOTE — Progress Notes (Signed)
  Subjective:    Patient ID: Michele Mills, female    DOB: 08-30-1975, 36 y.o.   MRN: [id number]  Diarrhea  This is a new problem. The current episode started in the past 7 days. The problem occurs 5 to 10 times per day. The problem has been unchanged. The stool consistency is described as mucous and watery. Associated symptoms include abdominal pain ("cramps"). Pertinent negatives include no arthralgias, bloating, chills, coughing, fever, headaches, increased  flatus, myalgias, sweats, URI, vomiting or weight loss. The symptoms are aggravated by nothing. Risk factors include recent antibiotic use and ill contacts. She has tried anti-motility drug for the symptoms. The treatment provided mild relief.      Review of Systems  Constitutional: Negative.  Negative for fever, chills and weight loss.  HENT: Negative.   Respiratory: Negative for cough, chest tightness, shortness of breath, wheezing and stridor.   Cardiovascular: Negative.   Gastrointestinal: Positive for abdominal pain ("cramps") and diarrhea. Negative for nausea, vomiting, constipation, blood in stool, abdominal distention, anal bleeding, rectal pain, bloating and flatus.  Genitourinary: Negative.   Musculoskeletal: Negative for myalgias, back pain, joint swelling, arthralgias and gait problem.  Skin: Negative.   Neurological: Negative for dizziness, tremors, syncope, facial asymmetry, weakness, light-headedness and headaches.  Hematological: Negative.   Psychiatric/Behavioral: Negative.        Objective:   Physical Exam  Vitals reviewed. Constitutional: She is oriented to person, place, and time. She appears well-developed and well-nourished. No distress.  HENT:  Head: Normocephalic and atraumatic.  Mouth/Throat: Oropharynx is clear and moist. No oropharyngeal exudate.  Eyes: Conjunctivae are normal. Right eye exhibits no discharge. Left eye exhibits no discharge. No scleral icterus.  Neck: Normal range of motion. Neck  supple. No JVD present. No tracheal deviation present. No thyromegaly present.  Cardiovascular: Normal rate, regular rhythm, normal heart sounds and intact distal pulses.  Exam reveals no gallop and no friction rub.   No murmur heard. Pulmonary/Chest: Effort normal and breath sounds normal. No stridor. No respiratory distress. She has no wheezes. She has no rales. She exhibits no tenderness.  Abdominal: Soft. Bowel sounds are normal. She exhibits no distension and no mass. There is no tenderness. There is no rebound and no guarding.  Musculoskeletal: Normal range of motion. She exhibits no edema and no tenderness.  Lymphadenopathy:    She has no cervical adenopathy.  Neurological: She is oriented to person, place, and time.  Skin: Skin is warm and dry. No rash noted. She is not diaphoretic. No erythema. No pallor.  Psychiatric: She has a normal mood and affect. Her behavior is normal. Judgment and thought content normal.          Assessment & Plan:

## 2011-07-08 NOTE — Assessment & Plan Note (Signed)
In light of her recent exposure to antibiotics I am concerned about C diff infection so I have empirically started her on flagyl and will check her stool today for infectious causes

## 2011-07-08 NOTE — Patient Instructions (Signed)
Diarrhea Infections caused by germs (bacterial) or a virus commonly cause diarrhea. Your caregiver has determined that with time, rest and fluids, the diarrhea should improve. In general, eat normally while drinking more water than usual. Although water may prevent dehydration, it does not contain salt and minerals (electrolytes). Broths, weak tea without caffeine and oral rehydration solutions (ORS) replace fluids and electrolytes. Small amounts of fluids should be taken frequently. Large amounts at one time may not be tolerated. Plain water may be harmful in infants and the elderly. Oral rehydrating solutions (ORS) are available at pharmacies and grocery stores. ORS replace water and important electrolytes in proper proportions. Sports drinks are not as effective as ORS and may be harmful due to sugars worsening diarrhea.  ORS is especially recommended for use in children with diarrhea. As a general guideline for children, replace any new fluid losses from diarrhea and/or vomiting with ORS as follows:   If your child weighs 22 pounds or under (10 kg or less), give 60-120 mL ( -  cup or 2 - 4 ounces) of ORS for each episode of diarrheal stool or vomiting episode.   If your child weighs more than 22 pounds (more than 10 kgs), give 120-240 mL ( - 1 cup or 4 - 8 ounces) of ORS for each diarrheal stool or episode of vomiting.   While correcting for dehydration, children should eat normally. However, foods high in sugar should be avoided because this may worsen diarrhea. Large amounts of carbonated soft drinks, juice, gelatin desserts and other highly sugared drinks should be avoided.   After correction of dehydration, other liquids that are appealing to the child may be added. Children should drink small amounts of fluids frequently and fluids should be increased as tolerated. Children should drink enough fluids to keep urine clear or pale yellow.   Adults should eat normally while drinking more fluids  than usual. Drink small amounts of fluids frequently and increase as tolerated. Drink enough fluids to keep urine clear or pale yellow. Broths, weak decaffeinated tea, lemon lime soft drinks (allowed to go flat) and ORS replace fluids and electrolytes.   Avoid:   Carbonated drinks.   Juice.   Extremely hot or cold fluids.   Caffeine drinks.   Fatty, greasy foods.   Alcohol.   Tobacco.   Too much intake of anything at one time.   Gelatin desserts.   Probiotics are active cultures of beneficial bacteria. They may lessen the amount and number of diarrheal stools in adults. Probiotics can be found in yogurt with active cultures and in supplements.   Wash hands well to avoid spreading bacteria and virus.   Anti-diarrheal medications are not recommended for infants and children.   Only take over-the-counter or prescription medicines for pain, discomfort or fever as directed by your caregiver. Do not give aspirin to children because it may cause Reye's Syndrome.   For adults, ask your caregiver if you should continue all prescribed and over-the-counter medicines.   If your caregiver has given you a follow-up appointment, it is very important to keep that appointment. Not keeping the appointment could result in a chronic or permanent injury, and disability. If there is any problem keeping the appointment, you must call back to this facility for assistance.  SEEK IMMEDIATE MEDICAL CARE IF:   You or your child is unable to keep fluids down or other symptoms or problems become worse in spite of treatment.   Vomiting or diarrhea develops and becomes persistent.     There is vomiting of blood or bile (green material).   There is blood in the stool or the stools are black and tarry.   There is no urine output in 6-8 hours or there is only a small amount of very dark urine.   Abdominal pain develops, increases or localizes.   You have a fever.   Your baby is older than 3 months with a  rectal temperature of 102 F (38.9 C) or higher.   Your baby is 3 months old or younger with a rectal temperature of 100.4 F (38 C) or higher.   You or your child develops excessive weakness, dizziness, fainting or extreme thirst.   You or your child develops a rash, stiff neck, severe headache or become irritable or sleepy and difficult to awaken.  MAKE SURE YOU:   Understand these instructions.   Will watch your condition.   Will get help right away if you are not doing well or get worse.  Document Released: 06/06/2002 Document Revised: 02/26/2011 Document Reviewed: 04/23/2009 [employer] Patient Information 2012 [employer]. 

## 2011-07-11 ENCOUNTER — Telehealth: Payer: Self-pay

## 2011-07-11 NOTE — Telephone Encounter (Signed)
There was a non-specific + for infection, how is she doing?

## 2011-07-11 NOTE — Telephone Encounter (Signed)
Patient called [email or web address] requesting lab results 

## 2011-07-18 ENCOUNTER — Encounter: Payer: Self-pay | Admitting: Internal Medicine

## 2011-07-18 LAB — CLOSTRIDIUM DIFFICILE TOXIN

## 2011-07-18 NOTE — Telephone Encounter (Signed)
Letter mailed to patient.

## 2011-08-11 ENCOUNTER — Encounter: Payer: Self-pay | Admitting: Internal Medicine

## 2011-08-11 ENCOUNTER — Ambulatory Visit (INDEPENDENT_AMBULATORY_CARE_PROVIDER_SITE_OTHER): Payer: Federal, State, Local not specified - PPO | Admitting: Internal Medicine

## 2011-08-11 VITALS — BP 110/78 | HR 80 | Temp 97.1°F | Resp 16

## 2011-08-11 DIAGNOSIS — H6690 Otitis media, unspecified, unspecified ear: Secondary | ICD-10-CM

## 2011-08-11 DIAGNOSIS — H6692 Otitis media, unspecified, left ear: Secondary | ICD-10-CM | POA: Insufficient documentation

## 2011-08-11 MED ORDER — AMOXICILLIN 500 MG PO CAPS: 500.0000 mg | ORAL_CAPSULE | Freq: Three times a day (TID) | ORAL | Status: AC

## 2011-08-11 NOTE — Patient Instructions (Signed)

## 2011-08-11 NOTE — Assessment & Plan Note (Signed)
Start amoxil and give pt ed material

## 2011-08-11 NOTE — Progress Notes (Signed)
Subjective:    Patient ID: Michele Mills, female    DOB: 09-Dec-1975, 36 y.o.   MRN: [id number]  Otalgia  There is pain in the left ear. The current episode started 1 to 4 weeks ago. The problem occurs every few hours. The problem has been gradually worsening. There has been no fever. The fever has been present for less than 1 day. The pain is mild. Associated symptoms include rhinorrhea and a sore throat. Pertinent negatives include no abdominal pain, coughing, diarrhea, drainage, ear discharge, headaches, hearing loss, neck pain, rash or vomiting. She has tried acetaminophen for the symptoms. The treatment provided moderate relief.      Review of Systems  Constitutional: Negative.   HENT: Positive for ear pain, congestion, sore throat, rhinorrhea and sinus pressure. Negative for hearing loss, nosebleeds, facial swelling, sneezing, drooling, mouth sores, trouble swallowing, neck pain, neck stiffness, dental problem, voice change, postnasal drip, tinnitus and ear discharge.   Eyes: Negative.   Respiratory: Negative for cough.   Cardiovascular: Negative.  Negative for chest pain and palpitations.  Gastrointestinal: Negative.  Negative for vomiting, abdominal pain and diarrhea.  Genitourinary: Negative.   Musculoskeletal: Negative.   Skin: Negative.  Negative for rash.  Neurological: Negative.  Negative for headaches.  Hematological: Negative.  Negative for adenopathy. Does not bruise/bleed easily.  Psychiatric/Behavioral: Negative.        Objective:   Physical Exam  Vitals reviewed. Constitutional: She is oriented to person, place, and time. She appears well-developed and well-nourished. No distress.  HENT:  Head: Normocephalic and atraumatic. No trismus in the jaw.  Right Ear: Hearing, tympanic membrane, external ear and ear canal normal.  Left Ear: No lacerations. No drainage, swelling or tenderness. No foreign bodies. No mastoid tenderness. Tympanic membrane is injected, erythematous  and retracted. Tympanic membrane is not scarred, not perforated and not bulging. Tympanic membrane mobility is normal. A middle ear effusion is present. No hemotympanum. No decreased hearing is noted.  Nose: Mucosal edema and rhinorrhea present. No nose lacerations, sinus tenderness, nasal deformity, septal deviation or nasal septal hematoma. No epistaxis.  No foreign bodies. Right sinus exhibits maxillary sinus tenderness. Right sinus exhibits no frontal sinus tenderness. Left sinus exhibits maxillary sinus tenderness. Left sinus exhibits no frontal sinus tenderness.  Mouth/Throat: Oropharynx is clear and moist. Mucous membranes are not pale, not dry and not cyanotic. No uvula swelling. No oropharyngeal exudate, posterior oropharyngeal edema, posterior oropharyngeal erythema or tonsillar abscesses.  Eyes: Conjunctivae are normal. Right eye exhibits no discharge. Left eye exhibits no discharge. No scleral icterus.  Neck: Normal range of motion. Neck supple. No JVD present. No tracheal deviation present. No thyromegaly present.  Cardiovascular: Normal rate, regular rhythm, normal heart sounds and intact distal pulses.  Exam reveals no gallop and no friction rub.   No murmur heard. Pulmonary/Chest: Effort normal and breath sounds normal. No stridor. No respiratory distress. She has no wheezes. She has no rales. She exhibits no tenderness.  Abdominal: Soft. Bowel sounds are normal. She exhibits no distension and no mass. There is no tenderness. There is no rebound and no guarding.  Musculoskeletal: Normal range of motion. She exhibits no edema and no tenderness.  Lymphadenopathy:    She has no cervical adenopathy.  Neurological: She is oriented to person, place, and time.  Skin: Skin is warm and dry. No rash noted. She is not diaphoretic. No erythema. No pallor.  Psychiatric: She has a normal mood and affect. Her behavior is normal. Judgment and thought content  normal.          Assessment & Plan:

## 2012-02-12 ENCOUNTER — Ambulatory Visit: Payer: Federal, State, Local not specified - PPO | Admitting: Internal Medicine

## 2012-05-03 LAB — HM PAP SMEAR: HM Pap smear: NORMAL

## 2012-05-05 ENCOUNTER — Ambulatory Visit: Payer: Federal, State, Local not specified - PPO | Admitting: Internal Medicine

## 2012-05-21 ENCOUNTER — Ambulatory Visit (INDEPENDENT_AMBULATORY_CARE_PROVIDER_SITE_OTHER): Payer: Federal, State, Local not specified - PPO | Admitting: Internal Medicine

## 2012-05-21 ENCOUNTER — Encounter: Payer: Self-pay | Admitting: Internal Medicine

## 2012-05-21 VITALS — BP 112/78 | HR 91 | Temp 97.4°F | Resp 16 | Wt 131.0 lb

## 2012-05-21 DIAGNOSIS — Z0000 Encounter for general adult medical examination without abnormal findings: Secondary | ICD-10-CM | POA: Insufficient documentation

## 2012-05-21 NOTE — Patient Instructions (Signed)
Preventive Care for Adults, Female A healthy lifestyle and preventive care can promote health and wellness. Preventive health guidelines for women include the following key practices.  A routine yearly physical is a good way to check with your caregiver about your health and preventive screening. It is a chance to share any concerns and updates on your health, and to receive a thorough exam.  Visit your dentist for a routine exam and preventive care every 6 months. Brush your teeth twice a day and floss once a day. Good oral hygiene prevents tooth decay and gum disease.  The frequency of eye exams is based on your age, health, family medical history, use of contact lenses, and other factors. Follow your caregiver's recommendations for frequency of eye exams.  Eat a healthy diet. Foods like vegetables, fruits, whole grains, low-fat dairy products, and lean protein foods contain the nutrients you need without too many calories. Decrease your intake of foods high in solid fats, added sugars, and salt. Eat the right amount of calories for you.Get information about a proper diet from your caregiver, if necessary.  Regular physical exercise is one of the most important things you can do for your health. Most adults should get at least 150 minutes of moderate-intensity exercise (any activity that increases your heart rate and causes you to sweat) each week. In addition, most adults need muscle-strengthening exercises on 2 or more days a week.  Maintain a healthy weight. The body mass index (BMI) is a screening tool to identify possible weight problems. It provides an estimate of body fat based on height and weight. Your caregiver can help determine your BMI, and can help you achieve or maintain a healthy weight.For adults 20 years and older:  A BMI below 18.5 is considered underweight.  A BMI of 18.5 to 24.9 is normal.  A BMI of 25 to 29.9 is considered overweight.  A BMI of 30 and above is  considered obese.  Maintain normal blood lipids and cholesterol levels by exercising and minimizing your intake of saturated fat. Eat a balanced diet with plenty of fruit and vegetables. Blood tests for lipids and cholesterol should begin at age 20 and be repeated every 5 years. If your lipid or cholesterol levels are high, you are over 50, or you are at high risk for heart disease, you may need your cholesterol levels checked more frequently.Ongoing high lipid and cholesterol levels should be treated with medicines if diet and exercise are not effective.  If you smoke, find out from your caregiver how to quit. If you do not use tobacco, do not start.  If you are pregnant, do not drink alcohol. If you are breastfeeding, be very cautious about drinking alcohol. If you are not pregnant and choose to drink alcohol, do not exceed 1 drink per day. One drink is considered to be 12 ounces (355 mL) of beer, 5 ounces (148 mL) of wine, or 1.5 ounces (44 mL) of liquor.  Avoid use of street drugs. Do not share needles with anyone. Ask for help if you need support or instructions about stopping the use of drugs.  High blood pressure causes heart disease and increases the risk of stroke. Your blood pressure should be checked at least every 1 to 2 years. Ongoing high blood pressure should be treated with medicines if weight loss and exercise are not effective.  If you are 55 to 35 years old, ask your caregiver if you should take aspirin to prevent strokes.  Diabetes   screening involves taking a blood sample to check your fasting blood sugar level. This should be done once every 3 years, after age 45, if you are within normal weight and without risk factors for diabetes. Testing should be considered at a younger age or be carried out more frequently if you are overweight and have at least 1 risk factor for diabetes.  Breast cancer screening is essential preventive care for women. You should practice "breast  self-awareness." This means understanding the normal appearance and feel of your breasts and may include breast self-examination. Any changes detected, no matter how small, should be reported to a caregiver. Women in their 20s and 30s should have a clinical breast exam (CBE) by a caregiver as part of a regular health exam every 1 to 3 years. After age 40, women should have a CBE every year. Starting at age 40, women should consider having a mammography (breast X-ray test) every year. Women who have a family history of breast cancer should talk to their caregiver about genetic screening. Women at a high risk of breast cancer should talk to their caregivers about having magnetic resonance imaging (MRI) and a mammography every year.  The Pap test is a screening test for cervical cancer. A Pap test can show cell changes on the cervix that might become cervical cancer if left untreated. A Pap test is a procedure in which cells are obtained and examined from the lower end of the uterus (cervix).  Women should have a Pap test starting at age 21.  Between ages 21 and 29, Pap tests should be repeated every 2 years.  Beginning at age 30, you should have a Pap test every 3 years as long as the past 3 Pap tests have been normal.  Some women have medical problems that increase the chance of getting cervical cancer. Talk to your caregiver about these problems. It is especially important to talk to your caregiver if a new problem develops soon after your last Pap test. In these cases, your caregiver may recommend more frequent screening and Pap tests.  The above recommendations are the same for women who have or have not gotten the vaccine for human papillomavirus (HPV).  If you had a hysterectomy for a problem that was not cancer or a condition that could lead to cancer, then you no longer need Pap tests. Even if you no longer need a Pap test, a regular exam is a good idea to make sure no other problems are  starting.  If you are between ages 65 and 70, and you have had normal Pap tests going back 10 years, you no longer need Pap tests. Even if you no longer need a Pap test, a regular exam is a good idea to make sure no other problems are starting.  If you have had past treatment for cervical cancer or a condition that could lead to cancer, you need Pap tests and screening for cancer for at least 20 years after your treatment.  If Pap tests have been discontinued, risk factors (such as a new sexual partner) need to be reassessed to determine if screening should be resumed.  The HPV test is an additional test that may be used for cervical cancer screening. The HPV test looks for the virus that can cause the cell changes on the cervix. The cells collected during the Pap test can be tested for HPV. The HPV test could be used to screen women aged 30 years and older, and should   be used in women of any age who have unclear Pap test results. After the age of 30, women should have HPV testing at the same frequency as a Pap test.  Colorectal cancer can be detected and often prevented. Most routine colorectal cancer screening begins at the age of 50 and continues through age 75. However, your caregiver may recommend screening at an earlier age if you have risk factors for colon cancer. On a yearly basis, your caregiver may provide home test kits to check for hidden blood in the stool. Use of a small camera at the end of a tube, to directly examine the colon (sigmoidoscopy or colonoscopy), can detect the earliest forms of colorectal cancer. Talk to your caregiver about this at age 50, when routine screening begins. Direct examination of the colon should be repeated every 5 to 10 years through age 75, unless early forms of pre-cancerous polyps or small growths are found.  Hepatitis C blood testing is recommended for all people born from 1945 through 1965 and any individual with known risks for hepatitis C.  Practice  safe sex. Use condoms and avoid high-risk sexual practices to reduce the spread of sexually transmitted infections (STIs). STIs include gonorrhea, chlamydia, syphilis, trichomonas, herpes, HPV, and human immunodeficiency virus (HIV). Herpes, HIV, and HPV are viral illnesses that have no cure. They can result in disability, cancer, and death. Sexually active women aged 25 and younger should be checked for chlamydia. Older women with new or multiple partners should also be tested for chlamydia. Testing for other STIs is recommended if you are sexually active and at increased risk.  Osteoporosis is a disease in which the bones lose minerals and strength with aging. This can result in serious bone fractures. The risk of osteoporosis can be identified using a bone density scan. Women ages 65 and over and women at risk for fractures or osteoporosis should discuss screening with their caregivers. Ask your caregiver whether you should take a calcium supplement or vitamin D to reduce the rate of osteoporosis.  Menopause can be associated with physical symptoms and risks. Hormone replacement therapy is available to decrease symptoms and risks. You should talk to your caregiver about whether hormone replacement therapy is right for you.  Use sunscreen with sun protection factor (SPF) of 30 or more. Apply sunscreen liberally and repeatedly throughout the day. You should seek shade when your shadow is shorter than you. Protect yourself by wearing long sleeves, pants, a wide-brimmed hat, and sunglasses year round, whenever you are outdoors.  Once a month, do a whole body skin exam, using a mirror to look at the skin on your back. Notify your caregiver of new moles, moles that have irregular borders, moles that are larger than a pencil eraser, or moles that have changed in shape or color.  Stay current with required immunizations.  Influenza. You need a dose every fall (or winter). The composition of the flu vaccine  changes each year, so being vaccinated once is not enough.  Pneumococcal polysaccharide. You need 1 to 2 doses if you smoke cigarettes or if you have certain chronic medical conditions. You need 1 dose at age 65 (or older) if you have never been vaccinated.  Tetanus, diphtheria, pertussis (Tdap, Td). Get 1 dose of Tdap vaccine if you are younger than age 65, are over 65 and have contact with an infant, are a healthcare worker, are pregnant, or simply want to be protected from whooping cough. After that, you need a Td   booster dose every 10 years. Consult your caregiver if you have not had at least 3 tetanus and diphtheria-containing shots sometime in your life or have a deep or dirty wound.  HPV. You need this vaccine if you are a woman age 26 or younger. The vaccine is given in 3 doses over 6 months.  Measles, mumps, rubella (MMR). You need at least 1 dose of MMR if you were born in 1957 or later. You may also need a second dose.  Meningococcal. If you are age 19 to 21 and a first-year college student living in a residence hall, or have one of several medical conditions, you need to get vaccinated against meningococcal disease. You may also need additional booster doses.  Zoster (shingles). If you are age 60 or older, you should get this vaccine.  Varicella (chickenpox). If you have never had chickenpox or you were vaccinated but received only 1 dose, talk to your caregiver to find out if you need this vaccine.  Hepatitis A. You need this vaccine if you have a specific risk factor for hepatitis A virus infection or you simply wish to be protected from this disease. The vaccine is usually given as 2 doses, 6 to 18 months apart.  Hepatitis B. You need this vaccine if you have a specific risk factor for hepatitis B virus infection or you simply wish to be protected from this disease. The vaccine is given in 3 doses, usually over 6 months. Preventive Services / Frequency Ages 19 to 39  Blood  pressure check.** / Every 1 to 2 years.  Lipid and cholesterol check.** / Every 5 years beginning at age 20.  Clinical breast exam.** / Every 3 years for women in their 20s and 30s.  Pap test.** / Every 2 years from ages 21 through 29. Every 3 years starting at age 30 through age 65 or 70 with a history of 3 consecutive normal Pap tests.  HPV screening.** / Every 3 years from ages 30 through ages 65 to 70 with a history of 3 consecutive normal Pap tests.  Hepatitis C blood test.** / For any individual with known risks for hepatitis C.  Skin self-exam. / Monthly.  Influenza immunization.** / Every year.  Pneumococcal polysaccharide immunization.** / 1 to 2 doses if you smoke cigarettes or if you have certain chronic medical conditions.  Tetanus, diphtheria, pertussis (Tdap, Td) immunization. / A one-time dose of Tdap vaccine. After that, you need a Td booster dose every 10 years.  HPV immunization. / 3 doses over 6 months, if you are 26 and younger.  Measles, mumps, rubella (MMR) immunization. / You need at least 1 dose of MMR if you were born in 1957 or later. You may also need a second dose.  Meningococcal immunization. / 1 dose if you are age 19 to 21 and a first-year college student living in a residence hall, or have one of several medical conditions, you need to get vaccinated against meningococcal disease. You may also need additional booster doses.  Varicella immunization.** / Consult your caregiver.  Hepatitis A immunization.** / Consult your caregiver. 2 doses, 6 to 18 months apart.  Hepatitis B immunization.** / Consult your caregiver. 3 doses usually over 6 months. Ages 40 to 64  Blood pressure check.** / Every 1 to 2 years.  Lipid and cholesterol check.** / Every 5 years beginning at age 20.  Clinical breast exam.** / Every year after age 40.  Mammogram.** / Every year beginning at age 40   and continuing for as long as you are in good health. Consult with your  caregiver.  Pap test.** / Every 3 years starting at age 30 through age 65 or 70 with a history of 3 consecutive normal Pap tests.  HPV screening.** / Every 3 years from ages 30 through ages 65 to 70 with a history of 3 consecutive normal Pap tests.  Fecal occult blood test (FOBT) of stool. / Every year beginning at age 50 and continuing until age 75. You may not need to do this test if you get a colonoscopy every 10 years.  Flexible sigmoidoscopy or colonoscopy.** / Every 5 years for a flexible sigmoidoscopy or every 10 years for a colonoscopy beginning at age 50 and continuing until age 75.  Hepatitis C blood test.** / For all people born from 1945 through 1965 and any individual with known risks for hepatitis C.  Skin self-exam. / Monthly.  Influenza immunization.** / Every year.  Pneumococcal polysaccharide immunization.** / 1 to 2 doses if you smoke cigarettes or if you have certain chronic medical conditions.  Tetanus, diphtheria, pertussis (Tdap, Td) immunization.** / A one-time dose of Tdap vaccine. After that, you need a Td booster dose every 10 years.  Measles, mumps, rubella (MMR) immunization. / You need at least 1 dose of MMR if you were born in 1957 or later. You may also need a second dose.  Varicella immunization.** / Consult your caregiver.  Meningococcal immunization.** / Consult your caregiver.  Hepatitis A immunization.** / Consult your caregiver. 2 doses, 6 to 18 months apart.  Hepatitis B immunization.** / Consult your caregiver. 3 doses, usually over 6 months. Ages 65 and over  Blood pressure check.** / Every 1 to 2 years.  Lipid and cholesterol check.** / Every 5 years beginning at age 20.  Clinical breast exam.** / Every year after age 40.  Mammogram.** / Every year beginning at age 40 and continuing for as long as you are in good health. Consult with your caregiver.  Pap test.** / Every 3 years starting at age 30 through age 65 or 70 with a 3  consecutive normal Pap tests. Testing can be stopped between 65 and 70 with 3 consecutive normal Pap tests and no abnormal Pap or HPV tests in the past 10 years.  HPV screening.** / Every 3 years from ages 30 through ages 65 or 70 with a history of 3 consecutive normal Pap tests. Testing can be stopped between 65 and 70 with 3 consecutive normal Pap tests and no abnormal Pap or HPV tests in the past 10 years.  Fecal occult blood test (FOBT) of stool. / Every year beginning at age 50 and continuing until age 75. You may not need to do this test if you get a colonoscopy every 10 years.  Flexible sigmoidoscopy or colonoscopy.** / Every 5 years for a flexible sigmoidoscopy or every 10 years for a colonoscopy beginning at age 50 and continuing until age 75.  Hepatitis C blood test.** / For all people born from 1945 through 1965 and any individual with known risks for hepatitis C.  Osteoporosis screening.** / A one-time screening for women ages 65 and over and women at risk for fractures or osteoporosis.  Skin self-exam. / Monthly.  Influenza immunization.** / Every year.  Pneumococcal polysaccharide immunization.** / 1 dose at age 65 (or older) if you have never been vaccinated.  Tetanus, diphtheria, pertussis (Tdap, Td) immunization. / A one-time dose of Tdap vaccine if you are over   65 and have contact with an infant, are a healthcare worker, or simply want to be protected from whooping cough. After that, you need a Td booster dose every 10 years.  Varicella immunization.** / Consult your caregiver.  Meningococcal immunization.** / Consult your caregiver.  Hepatitis A immunization.** / Consult your caregiver. 2 doses, 6 to 18 months apart.  Hepatitis B immunization.** / Check with your caregiver. 3 doses, usually over 6 months. ** Family history and personal history of risk and conditions may change your caregiver's recommendations. Document Released: 08/12/2001 Document Revised: 09/08/2011  Document Reviewed: 11/11/2010 [employer] Patient Information 2013 [employer].  

## 2012-05-21 NOTE — Assessment & Plan Note (Signed)
Labs reviewed Vaccines were updated Exam done Pt ed material was given

## 2012-05-21 NOTE — Progress Notes (Signed)
  Subjective:    Patient ID: Michele Mills, female    DOB: [birth year], 36 y.o.   MRN: [id number]  HPI  She returns for a physical - she feels well and offers no complaints. She saw her GYN about three weeks ago and had labs done, I have reviewed them and they look great.  Review of Systems  Constitutional: Negative.   HENT: Negative.   Eyes: Negative.   Respiratory: Negative.   Cardiovascular: Negative.   Gastrointestinal: Negative.   Genitourinary: Negative.   Musculoskeletal: Negative.   Skin: Negative.   Neurological: Negative for seizures.  Hematological: Negative.   Psychiatric/Behavioral: Negative.        Objective:   Physical Exam  Vitals reviewed. Constitutional: She is oriented to person, place, and time. She appears well-developed and well-nourished. No distress.  HENT:  Head: Normocephalic and atraumatic.  Mouth/Throat: No oropharyngeal exudate.  Eyes: Conjunctivae normal are normal. Right eye exhibits no discharge. Left eye exhibits no discharge. No scleral icterus.  Neck: Normal range of motion. Neck supple. No JVD present. No tracheal deviation present. No thyromegaly present.  Cardiovascular: Normal rate, regular rhythm, normal heart sounds and intact distal pulses.  Exam reveals no gallop and no friction rub.   No murmur heard. Pulmonary/Chest: Effort normal and breath sounds normal. No stridor. No respiratory distress. She has no wheezes. She has no rales. She exhibits no tenderness.  Abdominal: Soft. Bowel sounds are normal. She exhibits no distension and no mass. There is no tenderness. There is no rebound and no guarding.  Musculoskeletal: Normal range of motion. She exhibits no edema and no tenderness.  Lymphadenopathy:    She has no cervical adenopathy.  Neurological: She is oriented to person, place, and time.  Skin: Skin is warm and dry. No rash noted. She is not diaphoretic. No erythema. No pallor.  Psychiatric: She has a normal mood and affect. Her  behavior is normal. Judgment and thought content normal.          Assessment & Plan:

## 2012-06-15 DIAGNOSIS — D229 Melanocytic nevi, unspecified: Secondary | ICD-10-CM | POA: Insufficient documentation

## 2012-06-15 DIAGNOSIS — Z86018 Personal history of other benign neoplasm: Secondary | ICD-10-CM | POA: Insufficient documentation

## 2012-07-27 ENCOUNTER — Encounter: Payer: Self-pay | Admitting: Internal Medicine

## 2012-07-27 ENCOUNTER — Ambulatory Visit (INDEPENDENT_AMBULATORY_CARE_PROVIDER_SITE_OTHER): Payer: Federal, State, Local not specified - PPO | Admitting: Internal Medicine

## 2012-07-27 VITALS — BP 120/84 | HR 92 | Temp 97.1°F | Resp 16 | Wt 132.8 lb

## 2012-07-27 DIAGNOSIS — B9689 Other specified bacterial agents as the cause of diseases classified elsewhere: Secondary | ICD-10-CM

## 2012-07-27 DIAGNOSIS — J329 Chronic sinusitis, unspecified: Secondary | ICD-10-CM

## 2012-07-27 DIAGNOSIS — A499 Bacterial infection, unspecified: Secondary | ICD-10-CM

## 2012-07-27 MED ORDER — AMOXICILLIN 875 MG PO TABS: 875.0000 mg | ORAL_TABLET | Freq: Two times a day (BID) | ORAL | Status: DC

## 2012-07-27 NOTE — Assessment & Plan Note (Signed)
Start amoxil for the infection continue decongestants

## 2012-07-27 NOTE — Progress Notes (Signed)
Subjective:    Patient ID: Michele Mills, female    DOB: 10-24-1975, 37 y.o.   MRN: [id number]  Sinusitis This is a new problem. The current episode started in the past 7 days. The problem has been gradually worsening since onset. There has been no fever. Her pain is at a severity of 0/10. She is experiencing no pain. Associated symptoms include chills, congestion, ear pain, sinus pressure and a sore throat. Pertinent negatives include no coughing, diaphoresis, headaches, hoarse voice, neck pain, shortness of breath, sneezing or swollen glands. Past treatments include oral decongestants and spray decongestants. The treatment provided mild relief.      Review of Systems  Constitutional: Positive for chills. Negative for fever, diaphoresis, activity change, appetite change, fatigue and unexpected weight change.  HENT: Positive for ear pain, congestion, sore throat, rhinorrhea, postnasal drip and sinus pressure. Negative for nosebleeds, hoarse voice, facial swelling, sneezing, mouth sores, trouble swallowing, neck pain, voice change, tinnitus and ear discharge.   Eyes: Negative.   Respiratory: Negative for cough, chest tightness, shortness of breath, wheezing and stridor.   Cardiovascular: Negative for chest pain, palpitations and leg swelling.  Gastrointestinal: Negative for nausea, vomiting, abdominal pain, diarrhea, constipation and blood in stool.  Genitourinary: Negative.   Musculoskeletal: Negative for myalgias, back pain, joint swelling, arthralgias and gait problem.  Skin: Negative for color change, pallor, rash and wound.  Neurological: Negative for dizziness, tremors, facial asymmetry, weakness, light-headedness, numbness and headaches.  Hematological: Negative for adenopathy. Does not bruise/bleed easily.  Psychiatric/Behavioral: Negative.        Objective:   Physical Exam  Vitals reviewed. Constitutional: She is oriented to person, place, and time. She appears well-developed and  well-nourished.  Non-toxic appearance. She does not have a sickly appearance. She does not appear ill. No distress.  HENT:  Head: Normocephalic and atraumatic. No trismus in the jaw.  Right Ear: Hearing, tympanic membrane, external ear and ear canal normal.  Left Ear: Hearing, tympanic membrane, external ear and ear canal normal.  Nose: Mucosal edema and rhinorrhea present. No sinus tenderness. No epistaxis. Right sinus exhibits maxillary sinus tenderness. Right sinus exhibits no frontal sinus tenderness. Left sinus exhibits maxillary sinus tenderness. Left sinus exhibits no frontal sinus tenderness.  Mouth/Throat: Oropharynx is clear and moist and mucous membranes are normal. Mucous membranes are not pale, not dry and not cyanotic. No oral lesions. No uvula swelling. No oropharyngeal exudate, posterior oropharyngeal edema, posterior oropharyngeal erythema or tonsillar abscesses.  Eyes: Conjunctivae normal are normal. Right eye exhibits no discharge. Left eye exhibits no discharge. No scleral icterus.  Neck: Normal range of motion. Neck supple. No JVD present. No tracheal deviation present. No thyromegaly present.  Cardiovascular: Normal rate, regular rhythm, normal heart sounds and intact distal pulses.  Exam reveals no gallop and no friction rub.   No murmur heard. Pulmonary/Chest: Effort normal and breath sounds normal. No stridor. No respiratory distress. She has no wheezes. She has no rales. She exhibits no tenderness.  Abdominal: Soft. Bowel sounds are normal. She exhibits no distension and no mass. There is no tenderness. There is no rebound and no guarding.  Musculoskeletal: Normal range of motion. She exhibits no edema and no tenderness.  Lymphadenopathy:    She has no cervical adenopathy.  Neurological: She is oriented to person, place, and time.  Skin: Skin is warm and dry. No rash noted. She is not diaphoretic. No erythema. No pallor.  Psychiatric: She has a normal mood and affect. Her  behavior is  normal. Judgment and thought content normal.          Assessment & Plan:

## 2012-07-27 NOTE — Patient Instructions (Signed)

## 2012-07-29 ENCOUNTER — Encounter: Payer: Self-pay | Admitting: Internal Medicine

## 2012-07-29 ENCOUNTER — Telehealth: Payer: Self-pay | Admitting: Internal Medicine

## 2012-07-29 ENCOUNTER — Ambulatory Visit (INDEPENDENT_AMBULATORY_CARE_PROVIDER_SITE_OTHER): Payer: Federal, State, Local not specified - PPO | Admitting: Internal Medicine

## 2012-07-29 VITALS — BP 128/72 | HR 96 | Temp 97.6°F

## 2012-07-29 DIAGNOSIS — J019 Acute sinusitis, unspecified: Secondary | ICD-10-CM

## 2012-07-29 MED ORDER — METHYLPREDNISOLONE ACETATE 80 MG/ML IJ SUSP
80.0000 mg | Freq: Once | INTRAMUSCULAR | Status: AC
  Administered 2012-07-29: 80 mg via INTRAMUSCULAR

## 2012-07-29 MED ORDER — LEVOFLOXACIN 500 MG PO TABS: 500.0000 mg | ORAL_TABLET | Freq: Every day | ORAL | Status: DC

## 2012-07-29 NOTE — Progress Notes (Signed)
  Subjective:    Patient ID: Michele Mills, female    DOB: September 17, 1975, 37 y.o.   MRN: [id number]  HPI  Pt presents to the clinic today with c/o an allergic reaction to amoxicillin that she was given by Dr. Yetta Barre for sinusitis. She noticed the rash this morning. It was all over her body. It was itchy. She did stop taking the amoxicillin and took 2 Benadryl. The rash is not itching as much and the redness has gone down. She has never had a reaction to any drugs before. She did not have any difficulty breathing or feelings that her airway was swollen.  Review of Systems      Past Medical History  Diagnosis Date  . Seizure disorder     Current Outpatient Prescriptions  Medication Sig Dispense Refill  . levofloxacin (LEVAQUIN) 500 MG tablet Take 1 tablet (500 mg total) by mouth daily.  7 tablet  0    Allergies  Allergen Reactions  . Penicillins Rash    Family History  Problem Relation Age of Onset  . Stroke Mother   . Alcohol abuse Neg Hx   . Cancer Neg Hx   . Diabetes Neg Hx   . Early death Neg Hx   . Heart disease Neg Hx   . Hyperlipidemia Neg Hx   . Hypertension Neg Hx   . Kidney disease Neg Hx     History   Social History  . Marital Status: Married    Spouse Name: N/A    Number of Children: N/A  . Years of Education: N/A   Occupational History  . Not on file.   Social History Main Topics  . Smoking status: Never Smoker   . Smokeless tobacco: Never Used  . Alcohol Use: No  . Drug Use: No  . Sexually Active: Yes    Birth Control/ Protection: Pill   Other Topics Concern  . Not on file   Social History Narrative  . No narrative on file     Constitutional: Denies fever, malaise, fatigue, headache or abrupt weight changes.  Respiratory: Denies difficulty breathing, shortness of breath, cough or sputum production.   Cardiovascular: Denies chest pain, chest tightness, palpitations or swelling in the hands or feet.  Skin: Pt reports generalized red rash all  over body. Denies lesions or ulcercations.   No other specific complaints in a complete review of systems (except as listed in HPI above).  Objective:   Physical Exam   BP 128/72  Pulse 96  Temp 97.6 F (36.4 C) (Oral)  SpO2 97%  LMP 07/16/2012 Wt Readings from Last 3 Encounters:  07/27/12 132 lb 12 oz (60.215 kg)  05/21/12 131 lb (59.421 kg)  06/25/11 130 lb (58.968 kg)    General: Appears their stated age, well developed, well nourished in NAD. Skin: Generalized rash noted all over body, ore concentrated on hands and thiggs. Cardiovascular: Normal rate and rhythm. S1,S2 noted.  No murmur, rubs or gallops noted. No JVD or BLE edema. No carotid bruits noted. Pulmonary/Chest: Normal effort and positive vesicular breath sounds. No respiratory distress. No wheezes, rales or ronchi noted.        Assessment & Plan:   Rash, secondary to allergic reaction, new onset with additional workup required:  Stop the Amoxicillin now Continue to take Benadryl as needed 80 mg Depo Medrol IM today eRx for Levaquin x 7 days  RTC as needed or if symptoms persist

## 2012-07-29 NOTE — Telephone Encounter (Signed)
Contacted back line speaking to [facility] CMA for one of the providers who stated to tell patient just hold medication and call office in the morning for antibiotic change.  Patient notified

## 2012-07-29 NOTE — Patient Instructions (Signed)

## 2012-07-29 NOTE — Telephone Encounter (Signed)
Patient Information:  Caller Name: Tava  Phone: [phone]  Patient: Michele Mills, Michele Mills  Gender: Female  DOB: February 20, 1976  Age: 37 Years  PCP: Sanda Linger (Adults only)  Pregnant: No  Office Follow Up:  Does the office need to follow up with this patient?: Yes  Instructions For The Office: Allergic Reaction to Amoxicillen  . Advised Benadryl 50mg  po (patient took 25mg  earlier, encouraged 25mg  now)    Symptoms  Reason For Call & Symptoms: patient was seen in the office  07/27/12 and placed on Amoxicillen and awoke this morning with Rash. x4 doses thus far.   Location-"everywhere,but worse on hands". +itching described as red areas, occasional bumpy  with hand swelling.  Weight 132 lbs.  Face slightly swollen under eyes and cheeks.  Reviewed Health History In EMR: Yes  Reviewed Medications In EMR: Yes  Reviewed Allergies In EMR: Yes  Reviewed Surgeries / Procedures: No  Date of Onset of Symptoms: 07/29/2012  Treatments Tried: Benadryl 1 /25mg  11:00 am.  Treatments Tried Worked: Yes OB / GYN:  LMP: 07/08/2012  Guideline(s) Used:  Rash - Widespread on Drugs - Drug Reaction  Disposition Per Guideline:   Go to Office Now  Reason For Disposition Reached:   Face becomes swollen  Advice Given:  Stopping the Medication:  If medication is an antibiotic - stop the medication. Reason: possible allergy.  If rash is hives or is very itchy - stop the medication. Reason: possible allergy.  If the medication is an OTC drug - stop the medication. Reason: medicine is not essential.  Other rashes and prescription medications - continue the medication. Reason: lower likelihood of allergic drug reaction.  For Itchy Rashes:  Wash the skin once with soap to remove any irritants. Use Benadryl or take an Aveeno bath to reduce the itching.  Oral Antihistamine Medication for Itching:  Take an antihistamine like diphenhydramine (Benadryl) for widespread rashes that itch. The adult dosage of Benadryl is  25-50 mg by mouth 4 times daily.  CAUTION: This type of medication may cause sleepiness. Do not drink alcohol, drive, or operate dangerous machinery while taking antihistamines. Do not take these medications if you have prostate problems.  Read the package instructions thoroughly on all medications that you take.  Oatmeal Aveeno Bath for Itching:  Sprinkle contents of one Aveeno packet under running faucet with comfortably warm water. Bathe for 15 - 20 minutes, 1-2 times daily.  Pat dry with a towel. Do not rub the rash.  Call Back If:  You become worse.  Appointment Scheduled:  07/29/2012 13:45:00 Appointment Scheduled Provider:  Nicki Reaper

## 2012-07-29 NOTE — Telephone Encounter (Signed)
Patient calls noting side effect to generic Levaquin is seizures.  RN checked chart noting that this disorder is flagged on chart.  Spoke with pharmacist at [facility] at [phone] who also state that will a prior history if threshold was lowered it may occur.  Requesting and change to antibiotic for patient if provider thinks it is needed.

## 2012-07-30 ENCOUNTER — Other Ambulatory Visit: Payer: Self-pay | Admitting: Internal Medicine

## 2012-07-30 MED ORDER — AZITHROMYCIN 250 MG PO TABS: ORAL_TABLET | ORAL | Status: DC

## 2012-07-30 NOTE — Telephone Encounter (Signed)
Ash, We will switch her to azithromax x 5 days. Rene Kocher

## 2012-07-30 NOTE — Telephone Encounter (Signed)
Pt informed of new antibiotic.

## 2013-02-07 ENCOUNTER — Ambulatory Visit (INDEPENDENT_AMBULATORY_CARE_PROVIDER_SITE_OTHER): Payer: Federal, State, Local not specified - PPO | Admitting: Internal Medicine

## 2013-02-07 ENCOUNTER — Other Ambulatory Visit (INDEPENDENT_AMBULATORY_CARE_PROVIDER_SITE_OTHER): Payer: Federal, State, Local not specified - PPO

## 2013-02-07 ENCOUNTER — Encounter: Payer: Self-pay | Admitting: Internal Medicine

## 2013-02-07 VITALS — BP 118/80 | HR 87 | Temp 98.5°F | Resp 16

## 2013-02-07 DIAGNOSIS — J029 Acute pharyngitis, unspecified: Secondary | ICD-10-CM

## 2013-02-07 LAB — CBC WITH DIFFERENTIAL/PLATELET
Basophils Relative: 0.5 % (ref 0.0–3.0)
Eosinophils Absolute: 0.2 10*3/uL (ref 0.0–0.7)
MCHC: 33.4 g/dL (ref 30.0–36.0)
MCV: 89.6 fl (ref 78.0–100.0)
Monocytes Absolute: 0.5 10*3/uL (ref 0.1–1.0)
Neutrophils Relative %: 68.2 % (ref 43.0–77.0)
Platelets: 276 10*3/uL (ref 150.0–400.0)
RBC: 4.86 Mil/uL (ref 3.87–5.11)
RDW: 13.2 % (ref 11.5–14.6)

## 2013-02-07 MED ORDER — AZITHROMYCIN 250 MG PO TABS: ORAL_TABLET | ORAL | Status: DC

## 2013-02-07 NOTE — Patient Instructions (Signed)
Sore Throat A sore throat is pain, burning, irritation, or scratchiness of the throat. There is often pain or tenderness when swallowing or talking. A sore throat may be accompanied by other symptoms, such as coughing, sneezing, fever, and swollen neck glands. A sore throat is often the first sign of another sickness, such as a cold, flu, strep throat, or mononucleosis (commonly known as mono). Most sore throats go away without medical treatment. CAUSES  The most common causes of a sore throat include:  A viral infection, such as a cold, flu, or mono.  A bacterial infection, such as strep throat, tonsillitis, or whooping cough.  Seasonal allergies.  Dryness in the air.  Irritants, such as smoke or pollution.  Gastroesophageal reflux disease (GERD). HOME CARE INSTRUCTIONS   Only take over-the-counter medicines as directed by your caregiver.  Drink enough fluids to keep your urine clear or pale yellow.  Rest as needed.  Try using throat sprays, lozenges, or sucking on hard candy to ease any pain (if older than 4 years or as directed).  Sip warm liquids, such as broth, herbal tea, or warm water with honey to relieve pain temporarily. You may also eat or drink cold or frozen liquids such as frozen ice pops.  Gargle with salt water (mix 1 tsp salt with 8 oz of water).  Do not smoke and avoid secondhand smoke.  Put a cool-mist humidifier in your bedroom at night to moisten the air. You can also turn on a hot shower and sit in the bathroom with the door closed for 5 10 minutes. SEEK IMMEDIATE MEDICAL CARE IF:  You have difficulty breathing.  You are unable to swallow fluids, soft foods, or your saliva.  You have increased swelling in the throat.  Your sore throat does not get better in 7 days.  You have nausea and vomiting.  You have a fever or persistent symptoms for more than 2 3 days.  You have a fever and your symptoms suddenly get worse. MAKE SURE YOU:   Understand  these instructions.  Will watch your condition.  Will get help right away if you are not doing well or get worse. Document Released: 07/24/2004 Document Revised: 06/02/2012 Document Reviewed: 02/22/2012 ExitCare Patient Information 2014 [employer].  

## 2013-02-07 NOTE — Assessment & Plan Note (Signed)
She has some features suspicious for strep so I will treat with a Zpak but I will also check her for viral and atypical causes with a CBC/mono spot/ESR.

## 2013-02-07 NOTE — Progress Notes (Signed)
Subjective:    Patient ID: Michele Mills, female    DOB: Nov 17, 1975, 37 y.o.   MRN: [id number]  Sore Throat  This is a recurrent problem. The current episode started more than 1 month ago. The problem has been gradually worsening. Neither side of throat is experiencing more pain than the other. There has been no fever. The pain is mild. Associated symptoms include swollen glands. Pertinent negatives include no abdominal pain, congestion, coughing, diarrhea, drooling, ear discharge, ear pain, headaches, hoarse voice, plugged ear sensation, neck pain, shortness of breath, stridor, trouble swallowing or vomiting. She has tried NSAIDs and acetaminophen for the symptoms. The treatment provided moderate relief.      Review of Systems  Constitutional: Positive for chills. Negative for fever, diaphoresis, activity change, appetite change, fatigue and unexpected weight change.  HENT: Positive for sore throat. Negative for ear pain, congestion, hoarse voice, drooling, mouth sores, trouble swallowing, neck pain, voice change, sinus pressure and ear discharge.   Eyes: Negative.   Respiratory: Negative.  Negative for cough, shortness of breath and stridor.   Cardiovascular: Negative.   Gastrointestinal: Negative.  Negative for nausea, vomiting, abdominal pain, diarrhea and constipation.  Endocrine: Negative.   Genitourinary: Negative.   Musculoskeletal: Negative.  Negative for myalgias.  Skin: Negative.  Negative for rash.  Allergic/Immunologic: Negative.   Neurological: Negative.  Negative for headaches.  Hematological: Positive for adenopathy. Does not bruise/bleed easily.  Psychiatric/Behavioral: Negative.        Objective:   Physical Exam  Vitals reviewed. Constitutional: She is oriented to person, place, and time. She appears well-developed and well-nourished.  Non-toxic appearance. She does not have a sickly appearance. She does not appear ill. No distress.  HENT:  Head: Normocephalic and  atraumatic. No trismus in the jaw.  Right Ear: Hearing, tympanic membrane, external ear and ear canal normal.  Left Ear: Hearing, tympanic membrane, external ear and ear canal normal.  Mouth/Throat: Mucous membranes are normal. Mucous membranes are not pale, not dry and not cyanotic. No oral lesions. No edematous. Posterior oropharyngeal erythema present. No oropharyngeal exudate, posterior oropharyngeal edema or tonsillar abscesses.  Eyes: Conjunctivae are normal. Right eye exhibits no discharge. Left eye exhibits no discharge. No scleral icterus.  Neck: Normal range of motion. Neck supple. No JVD present. No tracheal deviation present. No thyromegaly present.  Cardiovascular: Normal rate, regular rhythm, normal heart sounds and intact distal pulses.  Exam reveals no gallop and no friction rub.   No murmur heard. Pulmonary/Chest: Effort normal and breath sounds normal. No stridor. No respiratory distress. She has no wheezes. She has no rales. She exhibits no tenderness.  Abdominal: Soft. Bowel sounds are normal. She exhibits no distension and no mass. There is no tenderness. There is no rebound and no guarding.  Musculoskeletal: Normal range of motion. She exhibits no edema and no tenderness.  Lymphadenopathy:       Head (right side): No submental, no submandibular, no tonsillar, no preauricular, no posterior auricular and no occipital adenopathy present.       Head (left side): No submental, no submandibular, no tonsillar, no preauricular, no posterior auricular and no occipital adenopathy present.    She has cervical adenopathy.       Right cervical: Superficial cervical adenopathy present. No deep cervical and no posterior cervical adenopathy present.      Left cervical: Superficial cervical adenopathy present. No deep cervical and no posterior cervical adenopathy present.    She has no axillary adenopathy.  Right: No supraclavicular adenopathy present.       Left: No supraclavicular  adenopathy present.  Neurological: She is oriented to person, place, and time.  Skin: Skin is warm and dry. No rash noted. She is not diaphoretic. No erythema. No pallor.  Psychiatric: She has a normal mood and affect. Her behavior is normal. Judgment and thought content normal.     Lab Results  Component Value Date   WBC 14.4* 12/15/2009   HGB 12.2 12/15/2009   HCT 35.2* 12/15/2009   PLT 191 12/15/2009   GLUCOSE 94 01/02/2008   ALT 35 01/02/2008   AST 29 01/02/2008   NA 136 01/02/2008   K 3.6 01/02/2008   CL 102 01/02/2008   CREATININE 0.56 01/02/2008   BUN 8 01/02/2008   CO2 26 01/02/2008        Assessment & Plan:

## 2013-04-18 ENCOUNTER — Ambulatory Visit (INDEPENDENT_AMBULATORY_CARE_PROVIDER_SITE_OTHER): Payer: Federal, State, Local not specified - PPO | Admitting: Internal Medicine

## 2013-04-18 ENCOUNTER — Encounter: Payer: Self-pay | Admitting: Internal Medicine

## 2013-04-18 ENCOUNTER — Ambulatory Visit (INDEPENDENT_AMBULATORY_CARE_PROVIDER_SITE_OTHER)
Admission: RE | Admit: 2013-04-18 | Discharge: 2013-04-18 | Disposition: A | Discharge status: Home / Self Care | Payer: Federal, State, Local not specified - PPO | Source: Ambulatory Visit | Attending: Internal Medicine | Admitting: Internal Medicine

## 2013-04-18 ENCOUNTER — Other Ambulatory Visit (INDEPENDENT_AMBULATORY_CARE_PROVIDER_SITE_OTHER): Payer: Federal, State, Local not specified - PPO

## 2013-04-18 VITALS — BP 134/90 | HR 74 | Temp 98.2°F | Resp 14 | Ht 63.0 in | Wt 131.0 lb

## 2013-04-18 DIAGNOSIS — K219 Gastro-esophageal reflux disease without esophagitis: Secondary | ICD-10-CM | POA: Insufficient documentation

## 2013-04-18 DIAGNOSIS — R079 Chest pain, unspecified: Secondary | ICD-10-CM

## 2013-04-18 LAB — COMPREHENSIVE METABOLIC PANEL
ALT: 14 U/L (ref 0–35)
Alkaline Phosphatase: 34 U/L — ABNORMAL LOW (ref 39–117)
Calcium: 9.3 mg/dL (ref 8.4–10.5)
Creatinine, Ser: 0.7 mg/dL (ref 0.4–1.2)
Sodium: 138 mEq/L (ref 135–145)
Total Bilirubin: 0.7 mg/dL (ref 0.3–1.2)
Total Protein: 7.7 g/dL (ref 6.0–8.3)

## 2013-04-18 LAB — CBC WITH DIFFERENTIAL/PLATELET
Basophils Absolute: 0 10*3/uL (ref 0.0–0.1)
HCT: 39 % (ref 36.0–46.0)
Lymphs Abs: 1.8 10*3/uL (ref 0.7–4.0)
MCHC: 34 g/dL (ref 30.0–36.0)
MCV: 87.4 fl (ref 78.0–100.0)
Monocytes Absolute: 0.3 10*3/uL (ref 0.1–1.0)
Monocytes Relative: 4.3 % (ref 3.0–12.0)
Neutrophils Relative %: 70.5 % (ref 43.0–77.0)
Platelets: 248 10*3/uL (ref 150.0–400.0)
RDW: 13.1 % (ref 11.5–14.6)
WBC: 7.3 10*3/uL (ref 4.5–10.5)

## 2013-04-18 LAB — SEDIMENTATION RATE: Sed Rate: 9 mm/hr (ref 0–22)

## 2013-04-18 LAB — D-DIMER, QUANTITATIVE (NOT AT ARMC): D-Dimer, Quant: 0.27 ug/mL-FEU (ref 0.00–0.48)

## 2013-04-18 LAB — POCT URINE PREGNANCY: Preg Test, Ur: NEGATIVE

## 2013-04-18 LAB — HCG, QUANTITATIVE, PREGNANCY: hCG, Beta Chain, Quant, S: <0.5 m[IU]/mL

## 2013-04-18 NOTE — Patient Instructions (Signed)
Chest Pain (Nonspecific) °It is often hard to give a specific diagnosis for the cause of chest pain. There is always a chance that your pain could be related to something serious, such as a heart attack or a blood clot in the lungs. You need to follow up with your caregiver for further evaluation. °CAUSES  °· Heartburn. °· Pneumonia or bronchitis. °· Anxiety or stress. °· Inflammation around your heart (pericarditis) or lung (pleuritis or pleurisy). °· A blood clot in the lung. °· A collapsed lung (pneumothorax). It can develop suddenly on its own (spontaneous pneumothorax) or from injury (trauma) to the chest. °· Shingles infection (herpes zoster virus). °The chest wall is composed of bones, muscles, and cartilage. Any of these can be the source of the pain. °· The bones can be bruised by injury. °· The muscles or cartilage can be strained by coughing or overwork. °· The cartilage can be affected by inflammation and become sore (costochondritis). °DIAGNOSIS  °Lab tests or other studies, such as X-rays, electrocardiography, stress testing, or cardiac imaging, may be needed to find the cause of your pain.  °TREATMENT  °· Treatment depends on what may be causing your chest pain. Treatment may include: °· Acid blockers for heartburn. °· Anti-inflammatory medicine. °· Pain medicine for inflammatory conditions. °· Antibiotics if an infection is present. °· You may be advised to change lifestyle habits. This includes stopping smoking and avoiding alcohol, caffeine, and chocolate. °· You may be advised to keep your head raised (elevated) when sleeping. This reduces the chance of acid going backward from your stomach into your esophagus. °· Most of the time, nonspecific chest pain will improve within 2 to 3 days with rest and mild pain medicine. °HOME CARE INSTRUCTIONS  °· If antibiotics were prescribed, take your antibiotics as directed. Finish them even if you start to feel better. °· For the next few days, avoid physical  activities that bring on chest pain. Continue physical activities as directed. °· Do not smoke. °· Avoid drinking alcohol. °· Only take over-the-counter or prescription medicine for pain, discomfort, or fever as directed by your caregiver. °· Follow your caregiver's suggestions for further testing if your chest pain does not go away. °· Keep any follow-up appointments you made. If you do not go to an appointment, you could develop lasting (chronic) problems with pain. If there is any problem keeping an appointment, you must call to reschedule. °SEEK MEDICAL CARE IF:  °· You think you are having problems from the medicine you are taking. Read your medicine instructions carefully. °· Your chest pain does not go away, even after treatment. °· You develop a rash with blisters on your chest. °SEEK IMMEDIATE MEDICAL CARE IF:  °· You have increased chest pain or pain that spreads to your arm, neck, jaw, back, or abdomen. °· You develop shortness of breath, an increasing cough, or you are coughing up blood. °· You have severe back or abdominal pain, feel nauseous, or vomit. °· You develop severe weakness, fainting, or chills. °· You have a fever. °THIS IS AN EMERGENCY. Do not wait to see if the pain will go away. Get medical help at once. Call your local emergency services (911 in [place] Do not drive yourself to the hospital. °MAKE SURE YOU:  °· Understand these instructions. °· Will watch your condition. °· Will get help right away if you are not doing well or get worse. °Document Released: 03/26/2005 Document Revised: 09/08/2011 Document Reviewed: 01/20/2008 °ExitCare® Patient Information ©2014 [employer]   LL[employer]. ° °

## 2013-04-18 NOTE — Progress Notes (Signed)
Subjective:    Patient ID: Michele Mills, female    DOB: May 27, 1976, 37 y.o.   MRN: [id number]  Chest Pain  This is a new problem. The current episode started in the past 7 days. The onset quality is gradual. The problem occurs intermittently. The problem has been unchanged. Pain location: diffuse pain across her collar bones, rib cage, and sternum. The pain is at a severity of 1/10. The pain is mild. The quality of the pain is described as dull. The pain does not radiate. Pertinent negatives include no abdominal pain, back pain, claudication, cough, diaphoresis, dizziness, exertional chest pressure, fever, headaches, hemoptysis, irregular heartbeat, leg pain, lower extremity edema, malaise/fatigue, nausea, near-syncope, numbness, orthopnea, palpitations, PND, shortness of breath, sputum production, syncope, vomiting or weakness. She has tried NSAIDs for the symptoms. The treatment provided mild relief.      Review of Systems  Constitutional: Negative.  Negative for fever, chills, malaise/fatigue, diaphoresis, appetite change and fatigue.  HENT: Negative.  Negative for trouble swallowing and voice change.   Eyes: Negative.   Respiratory: Negative.  Negative for cough, hemoptysis, sputum production, choking, chest tightness, shortness of breath, wheezing and stridor.   Cardiovascular: Positive for chest pain. Negative for palpitations, orthopnea, claudication, syncope, PND and near-syncope.  Gastrointestinal: Negative.  Negative for nausea, vomiting, abdominal pain, diarrhea, constipation and blood in stool.  Endocrine: Negative.   Genitourinary: Negative.   Musculoskeletal: Negative.  Negative for back pain and neck stiffness.  Skin: Negative.  Negative for rash.  Allergic/Immunologic: Negative.   Neurological: Negative.  Negative for dizziness, weakness, numbness and headaches.  Hematological: Negative.  Negative for adenopathy. Does not bruise/bleed easily.  Psychiatric/Behavioral: Negative.         Objective:   Physical Exam  Vitals reviewed. Constitutional: She is oriented to person, place, and time. She appears well-developed and well-nourished.  Non-toxic appearance. She does not have a sickly appearance. She does not appear ill. No distress.  HENT:  Head: Normocephalic and atraumatic.  Mouth/Throat: Oropharynx is clear and moist. No oropharyngeal exudate.  Eyes: Conjunctivae are normal. Right eye exhibits no discharge. Left eye exhibits no discharge. No scleral icterus.  Neck: Normal range of motion. Neck supple. No JVD present. No tracheal deviation present. No thyromegaly present.  Cardiovascular: Normal rate, regular rhythm, normal heart sounds and intact distal pulses.  Exam reveals no gallop and no friction rub.   No murmur heard. Pulmonary/Chest: Effort normal and breath sounds normal. No accessory muscle usage or stridor. Not tachypneic. No respiratory distress. She has no wheezes. She has no rales. She exhibits no mass, no tenderness, no bony tenderness, no crepitus, no edema, no deformity and no swelling.  Abdominal: Soft. Bowel sounds are normal. She exhibits no distension and no mass. There is no tenderness. There is no rebound and no guarding.  Musculoskeletal: Normal range of motion. She exhibits no edema and no tenderness.  Lymphadenopathy:    She has no cervical adenopathy.  Neurological: She is oriented to person, place, and time.  Skin: Skin is warm and dry. No rash noted. She is not diaphoretic. No erythema. No pallor.  Psychiatric: She has a normal mood and affect. Judgment and thought content normal.     Lab Results  Component Value Date   WBC 7.4 02/07/2013   HGB 14.6 02/07/2013   HCT 43.6 02/07/2013   PLT 276.0 02/07/2013   GLUCOSE 94 01/02/2008   ALT 35 01/02/2008   AST 29 01/02/2008   NA 136 01/02/2008  K 3.6 01/02/2008   CL 102 01/02/2008   CREATININE 0.56 01/02/2008   BUN 8 01/02/2008   CO2 26 01/02/2008       Assessment & Plan:

## 2013-04-18 NOTE — Assessment & Plan Note (Signed)
She has vague chest pain and a normal exam Her EKG is normal I will check her CXR to see if there is a structural lesion I will also check labs to look for signs of inflammation, infection, etc.

## 2013-05-05 ENCOUNTER — Other Ambulatory Visit: Payer: Self-pay

## 2013-05-19 ENCOUNTER — Ambulatory Visit (INDEPENDENT_AMBULATORY_CARE_PROVIDER_SITE_OTHER): Payer: Federal, State, Local not specified - PPO | Admitting: Internal Medicine

## 2013-05-19 ENCOUNTER — Telehealth: Payer: Self-pay | Admitting: *Deleted

## 2013-05-19 ENCOUNTER — Encounter: Payer: Self-pay | Admitting: Internal Medicine

## 2013-05-19 VITALS — BP 124/86 | HR 118 | Temp 98.7°F | Ht 64.0 in | Wt 130.1 lb

## 2013-05-19 DIAGNOSIS — J209 Acute bronchitis, unspecified: Secondary | ICD-10-CM

## 2013-05-19 MED ORDER — LEVOFLOXACIN 250 MG PO TABS: 250.0000 mg | ORAL_TABLET | Freq: Every day | ORAL | Status: DC

## 2013-05-19 MED ORDER — CEPHALEXIN 500 MG PO CAPS: 500.0000 mg | ORAL_CAPSULE | Freq: Four times a day (QID) | ORAL | Status: DC

## 2013-05-19 NOTE — Telephone Encounter (Signed)
Pt called states she was seen today and prescribed an antibiotic; however she is requesting a different antibiotic due to seizure disorder.  Please advise

## 2013-05-19 NOTE — Progress Notes (Signed)
Pre-visit discussion using our clinic review tool. No additional management support is needed unless otherwise documented below in the visit note.  

## 2013-05-19 NOTE — Progress Notes (Signed)
  Subjective:    Patient ID: Michele Mills, female    DOB: 02/18/1976, 37 y.o.   MRN: [id number]  HPI  Here with c/o worsening ST x 3 days, despite zpack now on 4th day for sinus pain which has improved. Also with Here with acute onset mild to mod 2-3 days ST, HA, general weakness and malaise, with prod cough greenish sputum, but Pt denies chest pain, increased sob or doe, wheezing, orthopnea, PND, increased LE swelling, palpitations, dizziness or syncope.   Pt denies polydipsia, polyuria.  Not pregnant, LMP last wk. No sick contacts Past Medical History  Diagnosis Date  . Seizure disorder    No past surgical history on file.  reports that she has never smoked. She has never used smokeless tobacco. She reports that she does not drink alcohol or use illicit drugs. family history includes Stroke in her mother. There is no history of Alcohol abuse, Cancer, Diabetes, Early death, Heart disease, Hyperlipidemia, Hypertension, or Kidney disease. Allergies  Allergen Reactions  . Penicillins Rash   Current Outpatient Prescriptions on File Prior to Visit  Medication Sig Dispense Refill  . omeprazole (PRILOSEC) 20 MG capsule Take 20 mg by mouth daily.        No current facility-administered medications on file prior to visit.   Review of Systems All otherwise neg per pt     Objective:   Physical Exam BP 124/86  Pulse 118  Temp(Src) 98.7 F (37.1 C) (Oral)  Ht 5\' 4"  (1.626 m)  Wt 130 lb 2 oz (59.024 kg)  BMI 22.32 kg/m2  SpO2 97% VS noted, mild ill Constitutional: Pt appears well-developed and well-nourished.  HENT: Head: NCAT.  Right Ear: External ear normal.  Left Ear: External ear normal.  Bilat tm's with mild erythema.  Max sinus areas mild tender.  Pharynx with mild erythema, no exudate Eyes: Conjunctivae and EOM are normal. Pupils are equal, round, and reactive to light.  Neck: Normal range of motion. Neck supple.  Cardiovascular: Normal rate and regular rhythm.   Pulmonary/Chest:  Effort normal and breath sounds mild decreased bilat, no wheezes, rales  Neurological: Pt is alert. Not confused  Skin: Skin is warm. No erythema.  Psychiatric: Pt behavior is normal. Thought content normal.     Assessment & Plan:

## 2013-05-19 NOTE — Telephone Encounter (Signed)
Done per emr 

## 2013-05-19 NOTE — Assessment & Plan Note (Signed)
Mild to mod, for antibx course,  to f/u any worsening symptoms or concerns 

## 2013-05-19 NOTE — Telephone Encounter (Signed)
Called the patient left a message rx sent in.

## 2013-05-19 NOTE — Patient Instructions (Signed)
Please take all new medication as prescribed - the antibiotic Please continue all other medications as before, and refills have been done if requested. You can also take Delsym OTC for cough, and/or Mucinex (or it's generic off brand) for congestion, and tylenol as needed for pain.  Please remember to sign up for My Chart if you have not done so, as this will be important to you in the future with finding out test results, communicating by private email, and scheduling acute appointments online when needed.  

## 2013-05-30 ENCOUNTER — Other Ambulatory Visit (HOSPITAL_COMMUNITY): Payer: Self-pay | Admitting: Obstetrics & Gynecology

## 2013-05-30 DIAGNOSIS — IMO0002 Reserved for concepts with insufficient information to code with codable children: Secondary | ICD-10-CM

## 2013-06-03 ENCOUNTER — Ambulatory Visit (HOSPITAL_COMMUNITY)
Admission: RE | Admit: 2013-06-03 | Discharge: 2013-06-03 | Disposition: A | Discharge status: Home / Self Care | Payer: Federal, State, Local not specified - PPO | Source: Ambulatory Visit | Attending: Obstetrics & Gynecology | Admitting: Obstetrics & Gynecology

## 2013-06-03 DIAGNOSIS — N979 Female infertility, unspecified: Secondary | ICD-10-CM | POA: Insufficient documentation

## 2013-06-03 DIAGNOSIS — IMO0002 Reserved for concepts with insufficient information to code with codable children: Secondary | ICD-10-CM

## 2013-06-03 MED ORDER — IOHEXOL 300 MG/ML  SOLN
20.0000 mL | Freq: Once | INTRAMUSCULAR | Status: AC | PRN
  Administered 2013-06-03: 34 mL

## 2013-11-24 ENCOUNTER — Telehealth: Payer: Self-pay | Admitting: Internal Medicine

## 2013-11-24 NOTE — Telephone Encounter (Signed)
Patient called and states that she is traveling to [place] on June 8th and is wanting to have her Hepatitis and Typhoid injections at [facility] off of [address].   She is requesting that we fax over prescriptions for her to have this done.  Patient also wants to know what Dr. Ronnald Ramp might recommend for travel diarrhea. Please advise.

## 2013-11-24 NOTE — Telephone Encounter (Signed)
IF there is no risk for pregnancy then she can get an RX for cipro for the diarrhea IF a pharmacy is willing to give her these vaccines then it does not require an RX from me - pharmacists have the autonomy to vaccinate in [facility]

## 2013-11-28 NOTE — Telephone Encounter (Signed)
Pt called back.  She wants the RX for Cipro.  She went to [facility].  They told her they have the vaccines, but that she would need an RX to get them.

## 2013-11-28 NOTE — Telephone Encounter (Signed)
If I have to write an Rx for the vaccines then I prefer to test her for Hep A and Hep B to see if she needs the vaccines, then if she needs the vaccine then we have it here to give to her I will write the Cipro Rx at that time

## 2013-11-29 ENCOUNTER — Other Ambulatory Visit: Payer: Self-pay | Admitting: Internal Medicine

## 2013-11-29 ENCOUNTER — Other Ambulatory Visit (INDEPENDENT_AMBULATORY_CARE_PROVIDER_SITE_OTHER): Payer: Federal, State, Local not specified - PPO

## 2013-11-29 DIAGNOSIS — Z0000 Encounter for general adult medical examination without abnormal findings: Secondary | ICD-10-CM

## 2013-11-29 LAB — HEPATITIS B CORE ANTIBODY, TOTAL: HEP B C TOTAL AB: NONREACTIVE

## 2013-11-29 LAB — HEPATITIS A ANTIBODY, TOTAL: Hep A Total Ab: NONREACTIVE

## 2013-11-29 LAB — HCG, QUANTITATIVE, PREGNANCY: HCG, BETA CHAIN, QUANT, S: 0.89 m[IU]/mL

## 2013-11-29 LAB — HEPATITIS B SURFACE ANTIBODY,QUALITATIVE: Hep B S Ab: NEGATIVE

## 2013-11-29 MED ORDER — CIPROFLOXACIN HCL 500 MG PO TABS: 500.0000 mg | ORAL_TABLET | Freq: Two times a day (BID) | ORAL | Status: AC

## 2013-11-29 NOTE — Telephone Encounter (Signed)
Spoke with patient and informed per MD. Pt will stop by to have labs done for Hep A & B. I advised that testing will let us know if vaccines are needed, if so will get here as well as RX for Cipro. Due to non stock of Typhoid he in office, pt states will still need script sent to [facility]. She is travelling in six days and states that vaccine has to be in a system a week before working. Please enter orders or advise with labs, unsure if antibody or antigen. Thanks

## 2013-11-30 ENCOUNTER — Other Ambulatory Visit: Payer: Self-pay | Admitting: Internal Medicine

## 2013-11-30 ENCOUNTER — Ambulatory Visit (INDEPENDENT_AMBULATORY_CARE_PROVIDER_SITE_OTHER): Payer: Federal, State, Local not specified - PPO | Admitting: *Deleted

## 2013-11-30 DIAGNOSIS — Z23 Encounter for immunization: Secondary | ICD-10-CM

## 2013-11-30 MED ORDER — TYPHOID VI POLYSACCHARIDE VACC 25 MCG/0.5ML IM SOLN: 0.5000 mL | Freq: Once | INTRAMUSCULAR | Status: DC

## 2014-03-28 ENCOUNTER — Other Ambulatory Visit (INDEPENDENT_AMBULATORY_CARE_PROVIDER_SITE_OTHER): Payer: Federal, State, Local not specified - PPO

## 2014-03-28 ENCOUNTER — Ambulatory Visit (INDEPENDENT_AMBULATORY_CARE_PROVIDER_SITE_OTHER): Payer: Federal, State, Local not specified - PPO | Admitting: Internal Medicine

## 2014-03-28 ENCOUNTER — Encounter: Payer: Self-pay | Admitting: Internal Medicine

## 2014-03-28 VITALS — BP 128/82 | HR 80 | Temp 98.4°F | Resp 16 | Wt 128.0 lb

## 2014-03-28 DIAGNOSIS — K2100 Gastro-esophageal reflux disease with esophagitis, without bleeding: Secondary | ICD-10-CM

## 2014-03-28 DIAGNOSIS — R109 Unspecified abdominal pain: Secondary | ICD-10-CM

## 2014-03-28 LAB — CBC WITH DIFFERENTIAL/PLATELET
BASOS PCT: 0.3 % (ref 0.0–3.0)
Basophils Absolute: 0 10*3/uL (ref 0.0–0.1)
Eosinophils Absolute: 0.1 10*3/uL (ref 0.0–0.7)
Eosinophils Relative: 0.8 % (ref 0.0–5.0)
HCT: 40.5 % (ref 36.0–46.0)
Hemoglobin: 13.6 g/dL (ref 12.0–15.0)
LYMPHS ABS: 1.2 10*3/uL (ref 0.7–4.0)
Lymphocytes Relative: 11 % — ABNORMAL LOW (ref 12.0–46.0)
MCHC: 33.6 g/dL (ref 30.0–36.0)
MCV: 90 fl (ref 78.0–100.0)
MONO ABS: 0.4 10*3/uL (ref 0.1–1.0)
Monocytes Relative: 3.8 % (ref 3.0–12.0)
Neutro Abs: 9.5 10*3/uL — ABNORMAL HIGH (ref 1.4–7.7)
Neutrophils Relative %: 84.1 % — ABNORMAL HIGH (ref 43.0–77.0)
Platelets: 233 10*3/uL (ref 150.0–400.0)
RBC: 4.5 Mil/uL (ref 3.87–5.11)
RDW: 13 % (ref 11.5–15.5)
WBC: 11.4 10*3/uL — ABNORMAL HIGH (ref 4.0–10.5)

## 2014-03-28 LAB — COMPREHENSIVE METABOLIC PANEL
ALK PHOS: 42 U/L (ref 39–117)
ALT: 11 U/L (ref 0–35)
AST: 15 U/L (ref 0–37)
Albumin: 4.4 g/dL (ref 3.5–5.2)
BUN: 8 mg/dL (ref 6–23)
CO2: 26 mEq/L (ref 19–32)
CREATININE: 0.7 mg/dL (ref 0.4–1.2)
Calcium: 9.3 mg/dL (ref 8.4–10.5)
Chloride: 105 mEq/L (ref 96–112)
GFR: 100 mL/min (ref >60.00)
Glucose, Bld: 90 mg/dL (ref 70–99)
POTASSIUM: 4.1 meq/L (ref 3.5–5.1)
Sodium: 137 mEq/L (ref 135–145)
Total Bilirubin: 0.8 mg/dL (ref 0.2–1.2)
Total Protein: 7.2 g/dL (ref 6.0–8.3)

## 2014-03-28 LAB — URINALYSIS, ROUTINE W REFLEX MICROSCOPIC
BILIRUBIN URINE: NEGATIVE
Hgb urine dipstick: NEGATIVE
Ketones, ur: NEGATIVE
Leukocytes, UA: NEGATIVE
Nitrite: NEGATIVE
PH: 6.5 (ref 5.0–8.0)
Specific Gravity, Urine: <=1.005 — AB (ref 1.000–1.030)
Total Protein, Urine: NEGATIVE
Urine Glucose: NEGATIVE
Urobilinogen, UA: 0.2 (ref 0.0–1.0)

## 2014-03-28 LAB — LIPASE: LIPASE: 24 U/L (ref 11.0–59.0)

## 2014-03-28 LAB — AMYLASE: Amylase: 43 U/L (ref 27–131)

## 2014-03-28 LAB — HCG, QUANTITATIVE, PREGNANCY: Quantitative HCG: 0.41 m[IU]/mL

## 2014-03-28 LAB — SEDIMENTATION RATE: Sed Rate: 6 mm/hr (ref 0–22)

## 2014-03-28 MED ORDER — OMEPRAZOLE 20 MG PO CPDR: 20.0000 mg | DELAYED_RELEASE_CAPSULE | Freq: Every day | ORAL | Status: DC

## 2014-03-28 NOTE — Progress Notes (Signed)
Subjective:    Patient ID: Michele Mills, female    DOB: 06-26-1976, 38 y.o.   MRN: [id number]  Abdominal Pain This is a recurrent problem. The current episode started in the past 7 days. The onset quality is gradual. The problem occurs intermittently. The problem has been unchanged. The pain is located in the generalized abdominal region. The pain is at a severity of 1/10. The patient is experiencing no pain. The quality of the pain is aching. The abdominal pain does not radiate. Associated symptoms include flatus. Pertinent negatives include no anorexia, arthralgias, belching, constipation, diarrhea, dysuria, fever, frequency, headaches, hematochezia, hematuria, melena, myalgias, nausea, vomiting or weight loss. Nothing aggravates the pain. The pain is relieved by nothing. She has tried nothing for the symptoms. The treatment provided no relief. Her past medical history is significant for GERD.  Gastrophageal Reflux She complains of abdominal pain, globus sensation, heartburn, a sore throat and water brash. She reports no belching, no chest pain, no choking, no coughing, no dysphagia, no early satiety, no hoarse voice, no nausea, no stridor, no tooth decay or no wheezing. This is a recurrent problem. The current episode started more than 1 month ago. The problem occurs frequently. The problem has been gradually worsening. The heartburn duration is several minutes. The heartburn is located in the substernum. The heartburn is of moderate intensity. The heartburn wakes her from sleep. The heartburn does not limit her activity. The heartburn doesn't change with position. Nothing aggravates the symptoms. Pertinent negatives include no anemia, fatigue, melena, muscle weakness, orthopnea or weight loss. She has tried an antacid and a histamine-2 antagonist for the symptoms. The treatment provided mild relief.      Review of Systems  Constitutional: Negative.  Negative for fever, chills, weight loss,  diaphoresis, appetite change and fatigue.  HENT: Positive for sore throat. Negative for hoarse voice, trouble swallowing and voice change.   Eyes: Negative.   Respiratory: Negative.  Negative for apnea, cough, choking, chest tightness, shortness of breath, wheezing and stridor.   Cardiovascular: Negative.  Negative for chest pain, palpitations and leg swelling.  Gastrointestinal: Positive for heartburn, abdominal pain and flatus. Negative for dysphagia, nausea, vomiting, diarrhea, constipation, blood in stool, melena, hematochezia, abdominal distention, anal bleeding, rectal pain and anorexia.  Endocrine: Negative.   Genitourinary: Negative.  Negative for dysuria, frequency and hematuria.  Musculoskeletal: Negative.  Negative for arthralgias, back pain, muscle weakness, myalgias and neck pain.  Skin: Negative.  Negative for rash.  Allergic/Immunologic: Negative.   Neurological: Negative.  Negative for headaches.  Hematological: Negative.  Negative for adenopathy. Does not bruise/bleed easily.  Psychiatric/Behavioral: Negative.        Objective:   Physical Exam  Vitals reviewed. Constitutional: She is oriented to person, place, and time. She appears well-developed and well-nourished.  Non-toxic appearance. She does not have a sickly appearance. She does not appear ill. No distress.  HENT:  Head: Normocephalic and atraumatic.  Mouth/Throat: Oropharynx is clear and moist. No oropharyngeal exudate.  Eyes: Conjunctivae are normal. Right eye exhibits no discharge. Left eye exhibits no discharge. No scleral icterus.  Neck: Normal range of motion. Neck supple. No JVD present. No tracheal deviation present. No thyromegaly present.  Cardiovascular: Normal rate, regular rhythm, normal heart sounds and intact distal pulses.  Exam reveals no gallop and no friction rub.   No murmur heard. Pulmonary/Chest: Effort normal and breath sounds normal. No stridor. No respiratory distress. She has no wheezes.  She has no rales. She exhibits no  tenderness.  Abdominal: Soft. Normal appearance and bowel sounds are normal. She exhibits no shifting dullness, no distension, no pulsatile liver, no fluid wave, no abdominal bruit, no ascites, no pulsatile midline mass and no mass. There is no hepatosplenomegaly, splenomegaly or hepatomegaly. There is no tenderness. There is no rigidity, no rebound, no guarding, no CVA tenderness, no tenderness at McBurney's point and negative Murphy's sign. No hernia. Hernia confirmed negative in the ventral area, confirmed negative in the right inguinal area and confirmed negative in the left inguinal area.  Musculoskeletal: Normal range of motion. She exhibits no edema and no tenderness.  Lymphadenopathy:    She has no cervical adenopathy.  Neurological: She is oriented to person, place, and time.  Skin: Skin is warm and dry. No rash noted. She is not diaphoretic. No erythema. No pallor.     Lab Results  Component Value Date   WBC 7.3 04/18/2013   HGB 13.3 04/18/2013   HCT 39.0 04/18/2013   PLT 248.0 04/18/2013   GLUCOSE 96 04/18/2013   ALT 14 04/18/2013   AST 13 04/18/2013   NA 138 04/18/2013   K 4.0 04/18/2013   CL 103 04/18/2013   CREATININE 0.7 04/18/2013   BUN 15 04/18/2013   CO2 28 04/18/2013       Assessment & Plan:

## 2014-03-28 NOTE — Patient Instructions (Signed)

## 2014-03-28 NOTE — Assessment & Plan Note (Addendum)
Her exam is normal and this does not appear to be an acute abd process Will check her labs and UA to see if there is concern for pathological abd process Will also treat the GERD

## 2014-03-28 NOTE — Assessment & Plan Note (Signed)
She previously responded well to a PPI Will restart a PPI Will check her CBC to see if there is concern for blood loss

## 2014-03-28 NOTE — Progress Notes (Signed)
Pre visit review using our clinic review tool, if applicable. No additional management support is needed unless otherwise documented below in the visit note. 

## 2014-03-31 ENCOUNTER — Other Ambulatory Visit: Payer: Self-pay | Admitting: Gastroenterology

## 2014-03-31 DIAGNOSIS — R1084 Generalized abdominal pain: Secondary | ICD-10-CM

## 2014-03-31 DIAGNOSIS — R102 Pelvic and perineal pain: Secondary | ICD-10-CM

## 2014-04-07 ENCOUNTER — Ambulatory Visit
Admission: RE | Admit: 2014-04-07 | Discharge: 2014-04-07 | Disposition: A | Discharge status: Home / Self Care | Payer: Federal, State, Local not specified - PPO | Source: Ambulatory Visit | Attending: Gastroenterology | Admitting: Gastroenterology

## 2014-04-07 DIAGNOSIS — R1084 Generalized abdominal pain: Secondary | ICD-10-CM

## 2014-04-07 DIAGNOSIS — R102 Pelvic and perineal pain: Secondary | ICD-10-CM

## 2014-05-16 LAB — HM PAP SMEAR: HM Pap smear: NORMAL

## 2014-05-29 ENCOUNTER — Encounter: Payer: Self-pay | Admitting: Internal Medicine

## 2014-08-07 ENCOUNTER — Encounter: Payer: Self-pay | Admitting: Internal Medicine

## 2014-08-07 ENCOUNTER — Ambulatory Visit (INDEPENDENT_AMBULATORY_CARE_PROVIDER_SITE_OTHER): Payer: Federal, State, Local not specified - PPO | Admitting: Internal Medicine

## 2014-08-07 VITALS — BP 110/82 | HR 85 | Temp 97.8°F | Ht 64.0 in | Wt 128.0 lb

## 2014-08-07 DIAGNOSIS — H65192 Other acute nonsuppurative otitis media, left ear: Secondary | ICD-10-CM

## 2014-08-07 MED ORDER — NEOMYCIN-POLYMYXIN-HC 3.5-10000-1 OT SOLN: 3.0000 [drp] | Freq: Four times a day (QID) | OTIC | Status: DC

## 2014-08-07 MED ORDER — AZITHROMYCIN 250 MG PO TABS: ORAL_TABLET | ORAL | Status: DC

## 2014-08-07 NOTE — Progress Notes (Signed)
Pre visit review using our clinic review tool, if applicable. No additional management support is needed unless otherwise documented below in the visit note. 

## 2014-08-07 NOTE — Progress Notes (Signed)
   Subjective:    Patient ID: Michele Mills, female    DOB: [birth year], 39 y.o.   MRN: [id number]  HPI  Symptoms began 08/03/14 as slight sore throat followed by rhinitis with clear-yellow secretions. Her 3-year-old 74-year-old have had upper respiratory tract symptoms  Last night she began to have left ear pain up to level IX-X. With Advil this dropped to a level III.  She describes  possible clear discharge from the left ear. She said some tenderness in her teeth when she brushes them. She denies other upper respiratory tract infection or extrinsic symptoms.   Review of Systems Frontal headache, facial pain , or frank dental pain denied. No fever , chills or sweats.  Extrinsic symptoms of itchy, watery eyes, sneezing, or angioedema are denied. There is no sputum production, wheezing,or  paroxysmal nocturnal dyspnea.     Objective:   Physical Exam  General appearance:Adequately nourished; no acute distress or increased work of breathing is present.  No  lymphadenopathy about the head, neck, or axilla noted.   Eyes: No conjunctival inflammation or lid edema is present. There is no scleral icterus.  Ears:  External ear exam shows no significant lesions or deformities.  Severe erythema L TM w/o discharge.  Nose:  External nasal examination shows no deformity or inflammation. Nasal mucosa are slightly erythematous without lesions or exudates. No septal dislocation or deviation.No obstruction to airflow.   Oral exam: Dental hygiene is good; lips and gums are healthy appearing.There is no oropharyngeal erythema or exudate noted.   Neck:  No deformities, thyromegaly, masses, or tenderness noted.   Supple with full range of motion without pain.   Heart:  Normal rate and regular rhythm. S1 and S2 normal without gallop, murmur, click, rub or other extra sounds.   Lungs:Chest clear to auscultation; no wheezes, rhonchi,rales ,or rubs present.  Extremities:  No cyanosis, edema, or clubbing  noted     Skin: Warm & dry w/o jaundice or tenting.       Assessment & Plan:  #1 otic pain with clinical otitis media See orders & AVS

## 2014-08-07 NOTE — Patient Instructions (Signed)

## 2014-09-04 ENCOUNTER — Ambulatory Visit: Payer: Federal, State, Local not specified - PPO | Attending: Obstetrics and Gynecology | Admitting: Physical Therapy

## 2014-09-04 ENCOUNTER — Encounter: Payer: Self-pay | Admitting: Physical Therapy

## 2014-09-04 DIAGNOSIS — G40909 Epilepsy, unspecified, not intractable, without status epilepticus: Secondary | ICD-10-CM | POA: Insufficient documentation

## 2014-09-04 DIAGNOSIS — N8184 Pelvic muscle wasting: Secondary | ICD-10-CM | POA: Diagnosis not present

## 2014-09-04 DIAGNOSIS — R1032 Left lower quadrant pain: Secondary | ICD-10-CM | POA: Insufficient documentation

## 2014-09-04 DIAGNOSIS — M25559 Pain in unspecified hip: Secondary | ICD-10-CM

## 2014-09-04 DIAGNOSIS — K219 Gastro-esophageal reflux disease without esophagitis: Secondary | ICD-10-CM | POA: Insufficient documentation

## 2014-09-04 DIAGNOSIS — M419 Scoliosis, unspecified: Secondary | ICD-10-CM | POA: Diagnosis not present

## 2014-09-04 DIAGNOSIS — R198 Other specified symptoms and signs involving the digestive system and abdomen: Secondary | ICD-10-CM

## 2014-09-04 DIAGNOSIS — M62838 Other muscle spasm: Secondary | ICD-10-CM | POA: Insufficient documentation

## 2014-09-04 DIAGNOSIS — M6289 Other specified disorders of muscle: Secondary | ICD-10-CM

## 2014-09-04 DIAGNOSIS — K5902 Outlet dysfunction constipation: Secondary | ICD-10-CM

## 2014-09-04 NOTE — Patient Instructions (Signed)
Posterior Hip (Supine)   Lie on back. Cross right ankle on other thigh. Slide other foot on surface toward buttocks until stretch is felt in back of hip. Pull leg towards you for extra stretch. Hold 30___ seconds. Relax. Repeat _2__ times. Do _1__ times a day. Repeat with legs switched.    Copyright  VHI. All rights reserved.  Hip Adductor: Knee Fall Out (Supine)   Lie on back, knees bent. Place hands on inner thighs. Pull legs apart until stretch is felt in inner thigh. Hold _30__ seconds. Relax. Repeat _2__ times. Do _1__ times a day.   Copyright  VHI. All rights reserved.  Hook-Lying   Lie with hips and knees bent. Allow body's muscles to relax. Place hands on belly. Inhale slowly and deeply for _3__ seconds, so hands move up. Hold 5 sec. Then take _3__ seconds to exhale. Repeat _5__ times. Do __3_ times a day.   Copyright  VHI. All rights reserved.

## 2014-09-04 NOTE — Therapy (Signed)
[facility] [address] [place], [place], [address] Phone: [phone]   Fax:  [phone]  Physical Therapy Evaluation  Patient Details  Name: Michele Mills MRN: [id number] Date of Birth: August 06, 1975 Referring Provider:  Yvette Rack, MD  Encounter Date: 09/04/2014      PT End of Session - 09/04/14 0933    Visit Number 1   Date for PT Re-Evaluation 11/27/14   PT Start Time 0845   PT Stop Time 0950   PT Time Calculation (min) 65 min   Activity Tolerance Patient tolerated treatment well   Behavior During Therapy [facility] for tasks assessed/performed      Past Medical History  Diagnosis Date  . Seizure disorder     History reviewed. No pertinent past surgical history.  There were no vitals taken for this visit.  Visit Diagnosis:  PFD (pelvic floor dysfunction) - Plan: PT plan of care cert/re-cert  Spastic pelvic floor syndrome - Plan: PT plan of care cert/re-cert  Pain in joint, pelvic region and thigh, unspecified laterality - Plan: PT plan of care cert/re-cert      Subjective Assessment - 09/04/14 0848    Symptoms 05/2014 patient began to have severe left lower quadrant  pain.  Cyst was found on right ovary. Pain in vulva, rectal, and sitting became painful. 1 month ago abdominal pain is better.  Increased pain with abdominal workout.    Pertinent History Patient has scoliosis and has been seeing a chiropractor periodically   Limitations Sitting;Other (comment)  abdominal workout   How long can you sit comfortably? always uncomfortable but after 30 minutes too painful.     How long can you stand comfortably? stand for 20 minutes before pain is aggravated   How long can you walk comfortably? walking feels better   Patient Stated Goals reduce pain in rectal and vaginal.  Get back to the gym   Currently in Pain? Yes   Pain Score 4    Pain Location Pelvis  rectum , vulva, low back   Pain Orientation Other  (Comment)  vulva, rectal   Pain Descriptors / Indicators Aching;Burning;Cramping;Throbbing;Sore   Pain Type Chronic pain   Pain Radiating Towards radiate to low back   Pain Onset More than a month ago  05/2014   Pain Frequency Constant   Aggravating Factors  sitting, exercise, standing   Pain Relieving Factors walking, gentle stetching   Effect of Pain on Daily Activities makes difficult for sitting, standing, and exercise   Multiple Pain Sites No          [facility] PT Assessment - 09/04/14 0001    Assessment   Medical Diagnosis spastic pelvic floor syndrome   Onset Date 06/23/14   Next MD Visit 09/07/2014   Prior Therapy No   Precautions   Precautions None   Balance Screen   Has the patient fallen in the past 6 months No   Has the patient had a decrease in activity level because of a fear of falling?  No   Is the patient reluctant to leave their home because of a fear of falling?  No   Prior Function   Level of Independence Independent with basic ADLs   Vocation Full time employment   Vocation Requirements sit and standing   Leisure exercise at gym but difficult   Posture/Postural Control   Posture/Postural Control Postural limitations   Postural Limitations Rounded Shoulders;Forward head;Decreased lumbar lordosis   AROM   Lumbar Flexion decreased  by 25%   Lumbar Extension decreased by 50%   Lumbar - Right Side Bend decreased by 25%   Lumbar - Left Side Bend decreased by 25%   Flexibility   Soft Tissue Assessment /Muscle Length yes   Hamstrings good length   Piriformis tight   Levator Ani tight   Obturator Internus tight   Palpation   Palpation Pelvis in good alignment. Decreased L1-L5 mobility. tender in bil. levator ani, obturator internist, hamstring, left piriformis, bil. hip adductors, bil. quadriceps                Pelvic Floor Special Questions - 09/04/14 0001    Prior Pregnancies Yes   Number of Pregnancies 2   Number of Vaginal Deliveries 2   Any  difficulty with labor and deliveries No   Episiotomy Performed Yes  medium size   Diastasis Recti --  1 finger width above umbilicus   Currently Sexually Active Yes   Is this Painful Yes  not able to have intercourse since the pain started   History of sexually transmitted disease No  HPV virus   Marinoff Scale pain prevents any attempts at intercourse   Urinary Leakage No   Fluid intake drinks water   Falling out feeling (prolapse) Yes   Activities that cause feeling of prolapse Only when she had the aboominal pain   Pelvic Floor Internal Exam --  Pt. not comfortable at this time to have an internal exam                  PT Education - 09/04/14 0931    Education provided Yes   Education Details flexibility exercises   Person(s) Educated Patient   Methods Explanation;Demonstration   Comprehension Verbalized understanding;Returned demonstration          PT Short Term Goals - 09/04/14 0936    PT SHORT TERM GOAL #1   Title return demonstration of flexibility exercises   Time 4   Period Weeks   Status New   PT SHORT TERM GOAL #2   Title sit with pain decreased >/= 25%   Time 4   Period Weeks   Status New   PT SHORT TERM GOAL #3   Title stand with pain decreased >/= 25%   Time 4   Period Weeks   Status New   PT SHORT TERM GOAL #4   Title understand correct sitting posture to decreased pelvic floor spasms   Time 4   Period Weeks   Status New           PT Long Term Goals - 09/04/14 6213    PT LONG TERM GOAL #1   Title sit with pain decreased >/= 75%   Time 12   Period Weeks   Status New   PT LONG TERM GOAL #2   Title stand with pain decreased >/= 75%   Time 12   Period Weeks   Status New   PT LONG TERM GOAL #3   Title Marinoff scale 1/3 so she is able to have intercourse   Time 12   Period Weeks   Status New   PT LONG TERM GOAL #4   Title return to exercise with correct body mechanics due to pain decreased >/= 75%   Time 12   Period  Weeks   Status New               Plan - 09/04/14 0934    Clinical Impression Statement Patient has  spasms in pelvic floor muscles, hip adductors, hamstring, abdominals.  Patient has difficulty with sitting, intercourse, standing and exercise due to pain.    Pt will benefit from skilled therapeutic intervention in order to improve on the following deficits Decreased range of motion;Difficulty walking;Impaired flexibility;Postural dysfunction;Decreased endurance;Decreased activity tolerance;Increased fascial restricitons;Pain;Increased muscle spasms;Decreased mobility;Decreased strength   Rehab Potential Good   PT Frequency 2x / week   PT Duration 12 weeks   PT Treatment/Interventions Moist Heat;Therapeutic activities;Patient/family education;Passive range of motion;Therapeutic exercise;Biofeedback;Manual techniques;Neuromuscular re-education;Cryotherapy;Electrical Stimulation;Functional mobility training   PT Next Visit Plan soft tissue work, flexibility exercises, see what MD says   Recommended Other Services None   Consulted and Agree with Plan of Care Patient         Problem List Patient Active Problem List   Diagnosis Date Noted  . Abdominal pain, unspecified site 03/28/2014  . GERD (gastroesophageal reflux disease) 04/18/2013  . Routine general medical examination at a health care facility 05/21/2012  . SEIZURE DISORDER 04/18/2010    GRAY,CHERYL,PT 09/04/2014, 12:06 PM   [facility] [address] [place], [place], [address] Phone: [phone]   Fax:  [phone]

## 2014-09-13 ENCOUNTER — Ambulatory Visit: Payer: Federal, State, Local not specified - PPO | Admitting: Physical Therapy

## 2014-09-13 ENCOUNTER — Encounter: Payer: Self-pay | Admitting: Physical Therapy

## 2014-09-13 DIAGNOSIS — R198 Other specified symptoms and signs involving the digestive system and abdomen: Secondary | ICD-10-CM

## 2014-09-13 DIAGNOSIS — M25559 Pain in unspecified hip: Secondary | ICD-10-CM

## 2014-09-13 DIAGNOSIS — N8184 Pelvic muscle wasting: Secondary | ICD-10-CM | POA: Diagnosis not present

## 2014-09-13 NOTE — Therapy (Signed)
[facility] [address] [place], [place], [address] Phone: [phone]   Fax:  [phone]  Physical Therapy Treatment  Patient Details  Name: Michele Mills MRN: [id number] Date of Birth: Oct 28, 1975 Referring Provider:  Yvette Rack, MD  Encounter Date: 09/13/2014      PT End of Session - 09/13/14 0844    Visit Number 2   Date for PT Re-Evaluation 11/27/14   PT Start Time 0800   PT Stop Time 0845   PT Time Calculation (min) 45 min   Activity Tolerance Patient tolerated treatment well   Behavior During Therapy [facility] for tasks assessed/performed      Past Medical History  Diagnosis Date  . Seizure disorder     History reviewed. No pertinent past surgical history.  There were no vitals filed for this visit.  Visit Diagnosis:  Pain in joint, pelvic region and thigh, unspecified laterality  Spastic pelvic floor syndrome      Subjective Assessment - 09/13/14 0803    Symptoms Patient reports no change in pain. MD will continue to follow her for the cell changes in the vagina. I have chronic yeast infection and recieved medicaiton. Cyst is a endometrioma and has not changed.    Pertinent History Patient has scoliosis and has been seeing a chiropractor periodically   How long can you sit comfortably? always uncomfortable but after 30 minutes too painful.     How long can you stand comfortably? stand for 20 minutes before pain is aggravated   How long can you walk comfortably? walking feels better   Patient Stated Goals reduce pain in rectal and vaginal.  Get back to the gym   Currently in Pain? Yes   Pain Score 5    Pain Location Pelvis  starts from back to pubic bone   Pain Orientation Mid   Pain Descriptors / Indicators Burning;Throbbing   Pain Type Chronic pain   Pain Onset More than a month ago   Pain Frequency Constant   Aggravating Factors  sitting, exercise, and standing   Pain Relieving Factors  walking and gentle stretching   Effect of Pain on Daily Activities makes it difficult for sitting, standing,and exercise   Multiple Pain Sites No                       [facility] Adult PT Treatment/Exercise - 09/13/14 0001    Manual Therapy   Manual Therapy Joint mobilization;Massage   Joint Mobilization lumbar facets left sidely with gapping   Massage soft tissue work to left quadratus, hamstring, hip adductor, quadriceps                PT Education - 09/13/14 7408    Education provided Yes   Education Details flexibility exercises, posture   Person(s) Educated Patient   Methods Explanation;Demonstration;Handout;Tactile cues   Comprehension Verbalized understanding;Returned demonstration          PT Short Term Goals - 09/13/14 0841    PT SHORT TERM GOAL #1   Title return demonstration of flexibility exercises   Time 4   Period Weeks   Status New  learning exercises   PT SHORT TERM GOAL #2   Title sit with pain decreased >/= 25%   Time 4   Period Weeks   Status New  no change just had initial eval   PT SHORT TERM GOAL #3   Title stand with pain decreased >/= 25%   Time 4  Period Weeks   Status New  no change due to just initial eval   PT SHORT TERM GOAL #4   Title understand correct sitting posture to decreased pelvic floor spasms   Time 4   Period Weeks   Status Achieved           PT Long Term Goals - 09/04/14 0932    PT LONG TERM GOAL #1   Title sit with pain decreased >/= 75%   Time 12   Period Weeks   Status New   PT LONG TERM GOAL #2   Title stand with pain decreased >/= 75%   Time 12   Period Weeks   Status New   PT LONG TERM GOAL #3   Title Marinoff scale 1/3 so she is able to have intercourse   Time 12   Period Weeks   Status New   PT LONG TERM GOAL #4   Title return to exercise with correct body mechanics due to pain decreased >/= 75%   Time 12   Period Weeks   Status New               Plan - 09/13/14  0845    Clinical Impression Statement Patient had decreased mobility of lumbar spine, tightness in legt quadratus, hamsting and quads.  After treatment pain decreased to 4/10.    Pt will benefit from skilled therapeutic intervention in order to improve on the following deficits Decreased range of motion;Difficulty walking;Impaired flexibility;Postural dysfunction;Decreased endurance;Decreased activity tolerance;Increased fascial restricitons;Pain;Increased muscle spasms;Decreased mobility;Decreased strength   Rehab Potential Good   PT Frequency 2x / week   PT Treatment/Interventions Moist Heat;Therapeutic activities;Patient/family education;Passive range of motion;Therapeutic exercise;Biofeedback;Manual techniques;Neuromuscular re-education;Cryotherapy;Electrical Stimulation;Functional mobility training   PT Next Visit Plan soft tissue work, scapular stabilization, abdominal ex   PT Home Exercise Plan scapular stabilization   Recommended Other Services None   Consulted and Agree with Plan of Care Patient        Problem List Patient Active Problem List   Diagnosis Date Noted  . Abdominal pain, unspecified site 03/28/2014  . GERD (gastroesophageal reflux disease) 04/18/2013  . Routine general medical examination at a health care facility 05/21/2012  . SEIZURE DISORDER 04/18/2010    GRAY,CHERYL, PT 09/13/2014, 8:53 AM  [facility] [address] [place], [place], [address] Phone: [phone]   Fax:  [phone]

## 2014-09-13 NOTE — Patient Instructions (Addendum)
[place] down with feet, knees, and tailbone; bring pubic bone toward navel. Press shoulders toward ceiling to start opening front of chest. Peel sternum up from floor and extend heart forward. Do not jut chin forward. Hold for _5___ breaths. Repeat __2__ times.  Copyright  VHI. All rights reserved.  Cat / Cow Flow   Inhale, press spine toward ceiling like a Halloween cat. Keeping strength in arms and abdominals, exhale to soften spine through neutral and into cow pose. Open chest and arch back. Initiate movement between cat and cow at tailbone, one vertebrae at a time. Repeat _5___ times.  Copyright  VHI. All rights reserved.  Side Waist Stretch from Child's Pose   From child's pose, walk hands to left. Reach right hand out on diagonal. Reach hips back toward heels making a C with torso. Breathe into right side waist. Hold for _15___ breaths. Repeat ____ times each side.  Copyright  VHI. All rights reserved.   Butterfly, Supine   Lie on back, feet together. Lower knees toward floor. Grab ankles . Hold _15__ seconds. Repeat __1_ times per session. Do __1_ sessions per day.  Copyright  VHI. All rights reserved.  1BACK: Child's Pose (Sciatica)   Sit in knee-chest position and reach arms forward. Separate knees for comfort. Hold position for _15__ breaths. Repeat _2__ times. Do __1_ times per day.  Copyright  VHI. All rights reserved.  Hamstring Stretch (Sitting)   Sitting, extend one leg and place hands on same thigh for support. Keeping torso straight, lean forward, sliding hands down leg, until a stretch is felt in back of thigh. Hold __30__ seconds. Repeat with other leg.1 time per day  Copyright  VHI. All rights reserved.  BACK: Hip Flexor Stretch   Interlace fingers on top of right knee. Shift weight forward. Continue breathing normally and hold position for _30 sec. Repeat on other leg. Alternate sides __1_ times. Do _1__ times per  day.  Copyright  VHI. All rights reserved.  Patient able to return demonstration correctly for all above exercises.  Physical therapist instructed patient in correct sitting posture and how to rock her pelvis to decrease strain, no crossing legs, using a lumbar roll, and sit with equal weight on bilateral buttocks.

## 2014-09-20 ENCOUNTER — Encounter: Payer: Federal, State, Local not specified - PPO | Admitting: Physical Therapy

## 2014-09-25 ENCOUNTER — Encounter: Payer: Self-pay | Admitting: Physical Therapy

## 2014-09-25 ENCOUNTER — Ambulatory Visit: Payer: Federal, State, Local not specified - PPO | Admitting: Physical Therapy

## 2014-09-25 DIAGNOSIS — M6289 Other specified disorders of muscle: Secondary | ICD-10-CM

## 2014-09-25 DIAGNOSIS — R198 Other specified symptoms and signs involving the digestive system and abdomen: Secondary | ICD-10-CM

## 2014-09-25 DIAGNOSIS — M25559 Pain in unspecified hip: Secondary | ICD-10-CM

## 2014-09-25 DIAGNOSIS — N8184 Pelvic muscle wasting: Secondary | ICD-10-CM | POA: Diagnosis not present

## 2014-09-25 NOTE — Therapy (Addendum)
[facility] [address], [place], [place], [address] Phone: [phone]   Fax:  [phone]  Physical Therapy Treatment  Patient Details  Name: Michele Mills MRN: [id number] Date of Birth: Oct 09, 1975 Referring Provider:  Yvette Rack, MD  Encounter Date: 09/25/2014      PT End of Session - 09/25/14 0927    Visit Number 3   Date for PT Re-Evaluation 11/27/14   PT Start Time 0845   PT Stop Time 0930   PT Time Calculation (min) 45 min   Activity Tolerance Patient tolerated treatment well   Behavior During Therapy [facility] for tasks assessed/performed      Past Medical History  Diagnosis Date  . Seizure disorder     History reviewed. No pertinent past surgical history.  There were no vitals filed for this visit.  Visit Diagnosis:  Pain in joint, pelvic region and thigh, unspecified laterality  Spastic pelvic floor syndrome  PFD (pelvic floor dysfunction)      Subjective Assessment - 09/25/14 0851    Symptoms Patient reports burning is better therefore yeast infection is getting better.  I see MD in 4 weeks for follow up with yeast infection. Pain is getting better.     Pertinent History Patient has scoliosis and has been seeing a chiropractor periodically   How long can you sit comfortably? 1 hour then feels radiating pain into bil. hips into buttocks.    How long can you stand comfortably? stand for 30 minutes.    How long can you walk comfortably? walking feels better   Patient Stated Goals reduce pain in rectal and vaginal.  Get back to the gym   Currently in Pain? Yes   Pain Score 3    Pain Location Pelvis   Pain Orientation Mid   Pain Descriptors / Indicators Throbbing;Burning  less burning, more throbbing   Pain Type Chronic pain   Pain Onset More than a month ago   Pain Frequency Intermittent  mostly with sitting   Aggravating Factors  sitting   Pain Relieving Factors walking and gentle  stretching   Effect of Pain on Daily Activities difficult to sit   Multiple Pain Sites No            [facility] PT Assessment - 09/25/14 0001    AROM   Lumbar Flexion decreased by 25% with tightness on left   Lumbar Extension decreased by 50%   Lumbar - Right Side Bend decreased by 25%   Lumbar - Left Side Bend decreased by 25%   Palpation   Palpation right ilium is higher than left                   [facility] Adult PT Treatment/Exercise - 09/25/14 0001    Lumbar Exercises: Stretches   Pelvic Tilt 5 reps  2 sets   Pelvic Tilt Limitations pelvic rock, pelvic circles, pelvic diagonals  movement was restricted and needed VC   Manual Therapy   Manual Therapy Massage;Myofascial release   Massage soft tissue work to left thoracolumbar fascia in right sidely, soft tissue work to left levator ani and obturator internist.                PT Education - 09/25/14 0926    Education provided Yes   Education Details flexibility exercises, soft tissue massage with foam roll   Person(s) Educated Patient   Methods Explanation;Demonstration;Handout   Comprehension Returned demonstration;Verbalized understanding  PT Short Term Goals - 09/25/14 0856    PT SHORT TERM GOAL #1   Title return demonstration of flexibility exercises   Time 4   Period Weeks   Status Achieved   PT SHORT TERM GOAL #2   Title sit with pain decreased >/= 25%   Time 4   Period Weeks   Status Achieved   PT SHORT TERM GOAL #3   Title stand with pain decreased >/= 25%   Time 4   Period Weeks   Status Achieved   PT SHORT TERM GOAL #4   Title understand correct sitting posture to decreased pelvic floor spasms   Time 4   Period Weeks   Status Achieved           PT Long Term Goals - 09/25/14 0930    PT LONG TERM GOAL #1   Title sit with pain decreased >/= 75%   Time 12   Period Weeks   Status On-going  25%   PT LONG TERM GOAL #2   Title stand with pain decreased >/= 75%   Time  12   Period Weeks   Status On-going  25%   PT LONG TERM GOAL #3   Title Marinoff scale 1/3 so she is able to have intercourse   Time 12   Period Weeks   Status On-going   PT LONG TERM GOAL #4   Title return to exercise with correct body mechanics due to pain decreased >/= 75%   Time 12   Period Weeks   Status On-going               Plan - 09/25/14 4163    Clinical Impression Statement Patient has tightness in left thoracolumbar fascia, left obturator internist and levator ani.  Patient has decreased mobility  of pelvic.  Patient has met STG. After treatment pain decreased to 2/10.   Pt will benefit from skilled therapeutic intervention in order to improve on the following deficits Decreased range of motion;Difficulty walking;Impaired flexibility;Postural dysfunction;Decreased endurance;Decreased activity tolerance;Increased fascial restricitons;Pain;Increased muscle spasms;Decreased mobility;Decreased strength   Rehab Potential Good   PT Frequency 2x / week   PT Duration 12 weeks   PT Treatment/Interventions Moist Heat;Therapeutic activities;Patient/family education;Passive range of motion;Therapeutic exercise;Biofeedback;Manual techniques;Neuromuscular re-education;Cryotherapy;Electrical Stimulation;Functional mobility training   PT Next Visit Plan pelvic mobility   PT Home Exercise Plan continue with current HEP   Consulted and Agree with Plan of Care Patient        Problem List Patient Active Problem List   Diagnosis Date Noted  . Abdominal pain, unspecified site 03/28/2014  . GERD (gastroesophageal reflux disease) 04/18/2013  . Routine general medical examination at a health care facility 05/21/2012  . SEIZURE DISORDER 04/18/2010    GRAY,CHERYL,PT  09/25/2014, 9:46 AM  [facility] [address] [place], [place], [address] Phone: [phone]   Fax:  [phone]

## 2014-09-25 NOTE — Patient Instructions (Addendum)
Side-Lying Lateral Trunk / IT Band Stretch   Lie on side right side  over pillows, bring left  leg back and down . Reach over head, keeping both legs on floor. Hold _15__ seconds. Do _2__ sets of __1_ repetitions. Advanced: Hyperextend top arm to increase stretch on IT band.  Copyright  VHI. All rights reserved.  Adductors, Frog Squat   Crouch with  Knees apart.  Gently push knees outward.Breath in fulling stomach with air. Hold _5__ seconds. Repeat _2__ times per session. Do _2__ sessions per day.  Copyright  VHI. All rights reserved.  Use foam roll in sitting to massage pelvic floor muscles.    Thoracolumbar Side-Bend: Arms Folded (Standing)   Arms folded above head, lean to left side until stretch is felt. Hold _15___ seconds. Relax. Repeat _2___ times per set. Do __1__ sets per session. Do __1__ sessions per day.  http://orth.exer.us/266   Copyright  VHI. All rights reserved.  Patient able to return demonstration with above exercises correctly.

## 2014-10-02 ENCOUNTER — Encounter: Payer: Self-pay | Admitting: Physical Therapy

## 2014-10-02 ENCOUNTER — Ambulatory Visit: Payer: Federal, State, Local not specified - PPO | Attending: Obstetrics and Gynecology | Admitting: Physical Therapy

## 2014-10-02 DIAGNOSIS — N8184 Pelvic muscle wasting: Secondary | ICD-10-CM | POA: Insufficient documentation

## 2014-10-02 DIAGNOSIS — R1032 Left lower quadrant pain: Secondary | ICD-10-CM | POA: Insufficient documentation

## 2014-10-02 DIAGNOSIS — M62838 Other muscle spasm: Secondary | ICD-10-CM | POA: Insufficient documentation

## 2014-10-02 DIAGNOSIS — K219 Gastro-esophageal reflux disease without esophagitis: Secondary | ICD-10-CM | POA: Insufficient documentation

## 2014-10-02 DIAGNOSIS — G40909 Epilepsy, unspecified, not intractable, without status epilepticus: Secondary | ICD-10-CM | POA: Insufficient documentation

## 2014-10-02 DIAGNOSIS — M25559 Pain in unspecified hip: Secondary | ICD-10-CM

## 2014-10-02 DIAGNOSIS — M419 Scoliosis, unspecified: Secondary | ICD-10-CM | POA: Insufficient documentation

## 2014-10-02 DIAGNOSIS — M6289 Other specified disorders of muscle: Secondary | ICD-10-CM

## 2014-10-02 DIAGNOSIS — R198 Other specified symptoms and signs involving the digestive system and abdomen: Secondary | ICD-10-CM

## 2014-10-02 NOTE — Patient Instructions (Signed)
STRETCHING THE PELVIC FLOOR MUSCLES NO DILATOR  Supplies . Vaginal lubricant . Mirror (optional) . Gloves (optional) Positioning . Start in a semi-reclined position with your head propped up. Bend your knees and place your thumb or finger at the vaginal opening. Procedure . Apply a moderate amount of lubricant on the outer skin of your vagina, the labia minora.  Apply additional lubricant to your finger. Marland Kitchen Spread the skin away from the vaginal opening. Place the end of your finger at the opening. . Do a maximum contraction of the pelvic floor muscles. Tighten the vagina and the anus maximally and relax. . When you know they are relaxed, gently and slowly insert your finger into your vagina, directing your finger slightly downward, for 2-3 inches of insertion. . Relax and stretch the 6 o'clock position . Hold each stretch for 2 min___ and repeat _1__ time with rest breaks of _1__ seconds between each stretch. . Repeat the stretching in the 4 o'clock and 8 o'clock positions. . Total time should be _6__ minutes, _1__ x per day.  Note the amount of theme your were able to achieve and your tolerance to your finger in your vagina. . Once you have accomplished the techniques you may try them in standing with one foot resting on the tub, or in other positions.  This is a good stretch to do in the shower if you don't need to use lubricant.   Bear Down   Exhaling, bear down as if to have a bowel movement.hold 3 sec.  Repeat _3__ times. Do _2-3__ times a day.  Copyright  VHI. All rights reserved.

## 2014-10-02 NOTE — Therapy (Signed)
[facility] [address] [place], [place], [address] Phone: [phone]   Fax:  [phone]  Physical Therapy Treatment  Patient Details  Name: Michele Mills MRN: [id number] Date of Birth: 06/30/1976 Referring Provider:  Yvette Rack, MD  Encounter Date: 10/02/2014      PT End of Session - 10/02/14 0925    Visit Number 4   Date for PT Re-Evaluation 11/27/14   PT Start Time 0845   PT Stop Time 0930   PT Time Calculation (min) 45 min   Equipment Utilized During Treatment --  mirror   Activity Tolerance Patient tolerated treatment well   Behavior During Therapy [facility] for tasks assessed/performed      Past Medical History  Diagnosis Date  . Seizure disorder     History reviewed. No pertinent past surgical history.  There were no vitals filed for this visit.  Visit Diagnosis:  Pain in joint, pelvic region and thigh, unspecified laterality  Spastic pelvic floor syndrome  PFD (pelvic floor dysfunction)      Subjective Assessment - 10/02/14 0853    Subjective Patient has not returned to running and the gym.  Patient has not had intercourse due to pian and timing.             [facility] PT Assessment - 10/02/14 0001    AROM   Lumbar Flexion decreased by 25% with decreased opening of left L2-L4                   [facility] Adult PT Treatment/Exercise - 10/02/14 0001    Manual Therapy   Manual Therapy Joint mobilization;Massage;Myofascial release;Internal Pelvic Floor   Joint Mobilization gapping of L2 L5 on left   Massage soft tissue work to bil. obtrator internist and levator anie externally and internally      Patient confirms identification and approved physical therapist to perform internal soft tissue work to perineum. Physical therapist verbally instructed patient on how to perform soft tissue work to perineum to increased mobility of the introitus.  Patient was able to return demonstration  using gloves on her thumb and lubricant.  Patient used a Geologist, engineering to understand how to perform correctly.            PT Education - 10/02/14 0925    Education Details bearing down, sefl soft tissue work on vaginal opening   Person(s) Educated Patient   Methods Explanation;Demonstration;Tactile cues;Handout;Verbal cues   Comprehension Verbalized understanding;Returned demonstration          PT Short Term Goals - 09/25/14 0856    PT SHORT TERM GOAL #1   Title return demonstration of flexibility exercises   Time 4   Period Weeks   Status Achieved   PT SHORT TERM GOAL #2   Title sit with pain decreased >/= 25%   Time 4   Period Weeks   Status Achieved   PT SHORT TERM GOAL #3   Title stand with pain decreased >/= 25%   Time 4   Period Weeks   Status Achieved   PT SHORT TERM GOAL #4   Title understand correct sitting posture to decreased pelvic floor spasms   Time 4   Period Weeks   Status Achieved           PT Long Term Goals - 10/02/14 6962    PT LONG TERM GOAL #1   Title sit with pain decreased >/= 75%   Time 12   Period Weeks  Status On-going  decreased by 30%   PT LONG TERM GOAL #2   Title stand with pain decreased >/= 75%   Time 12   Period Weeks   Status On-going  50%   PT LONG TERM GOAL #3   Title Marinoff scale 1/3 so she is able to have intercourse   Time 12   Period Weeks   Status On-going  not able to have intercourse   PT LONG TERM GOAL #4   Title return to exercise with correct body mechanics due to pain decreased >/= 75%   Time 12   Period Weeks   Status On-going  yoga and walking               Plan - 10/02/14 0927    Clinical Impression Statement Patient has spasms in bil. levator ani and obturator internist.  Patient has improved standing time and walking. Patient has not met intercourse goal due to pain.    Pt will benefit from skilled therapeutic intervention in order to improve on the following deficits Decreased range of  motion;Difficulty walking;Impaired flexibility;Postural dysfunction;Decreased endurance;Decreased activity tolerance;Increased fascial restricitons;Pain;Increased muscle spasms;Decreased mobility;Decreased strength   Rehab Potential Good   PT Frequency 2x / week   PT Duration 12 weeks   PT Treatment/Interventions Moist Heat;Therapeutic activities;Patient/family education;Passive range of motion;Therapeutic exercise;Biofeedback;Manual techniques;Neuromuscular re-education;Cryotherapy;Electrical Stimulation;Functional mobility training   PT Next Visit Plan internal soft tissue work.  Pelvic clock   PT Home Exercise Plan internal soft tissue work   Consulted and Agree with Plan of Care Patient        Problem List Patient Active Problem List   Diagnosis Date Noted  . Abdominal pain, unspecified site 03/28/2014  . GERD (gastroesophageal reflux disease) 04/18/2013  . Routine general medical examination at a health care facility 05/21/2012  . SEIZURE DISORDER 04/18/2010    Theodore Virgin,PT 10/02/2014, 9:32 AM  [facility] [address] [place], [place], [address] Phone: [phone]   Fax:  [phone]

## 2014-10-04 ENCOUNTER — Encounter: Payer: Self-pay | Admitting: Physical Therapy

## 2014-10-04 ENCOUNTER — Ambulatory Visit: Payer: Federal, State, Local not specified - PPO | Admitting: Physical Therapy

## 2014-10-04 DIAGNOSIS — M25559 Pain in unspecified hip: Secondary | ICD-10-CM

## 2014-10-04 DIAGNOSIS — R198 Other specified symptoms and signs involving the digestive system and abdomen: Secondary | ICD-10-CM

## 2014-10-04 DIAGNOSIS — M6289 Other specified disorders of muscle: Secondary | ICD-10-CM

## 2014-10-04 DIAGNOSIS — N8184 Pelvic muscle wasting: Secondary | ICD-10-CM | POA: Diagnosis not present

## 2014-10-04 NOTE — Therapy (Signed)
[facility] [address], [place], [place], [address] Phone: [phone]   Fax:  [phone]  Physical Therapy Treatment  Patient Details  Name: Michele Mills MRN: [id number] Date of Birth: 25-Mar-1976 Referring Provider:  Yvette Rack, MD  Encounter Date: 10/04/2014      PT End of Session - 10/04/14 0924    Visit Number 5   Date for PT Re-Evaluation 11/27/14   PT Start Time 0845   PT Stop Time 0930   PT Time Calculation (min) 45 min   Activity Tolerance Patient tolerated treatment well   Behavior During Therapy [facility] for tasks assessed/performed      Past Medical History  Diagnosis Date  . Seizure disorder     History reviewed. No pertinent past surgical history.  There were no vitals filed for this visit.  Visit Diagnosis:  Pain in joint, pelvic region and thigh, unspecified laterality  Spastic pelvic floor syndrome  PFD (pelvic floor dysfunction)      Subjective Assessment - 10/04/14 0848    Subjective I felt good after last visit. The muscle felt more relaxed making it easier to sit.     Pertinent History Patient has scoliosis and has been seeing a chiropractor periodically   Limitations Sitting;Standing   How long can you sit comfortably? 1 hour then feels radiating pain into bil. hips into buttocks.    How long can you stand comfortably? stand for 1 hour   How long can you walk comfortably? No diffilculty   Patient Stated Goals reduce pain in rectal and vaginal.  Get back to the gym   Currently in Pain? Yes   Pain Score 3    Pain Location Pelvis  buttocks , along SI joint   Pain Orientation Right;Left   Pain Descriptors / Indicators Throbbing   Pain Type Chronic pain   Pain Onset More than a month ago   Pain Frequency Intermittent   Aggravating Factors  sitting   Pain Relieving Factors walking and gentle stretcihing   Multiple Pain Sites No                       [facility] Adult  PT Treatment/Exercise - 10/04/14 0001    Manual Therapy   Massage Patient confirms identification and approved  PT to perform internal soft tissue work to pelvic floor, bil. obturatoru internist with different hip positions and left levator ani wiht contract relax                PT Education - 10/04/14 0923    Education provided Yes   Education Details ball exercises, pelvic floor exercises.  PT instructed patient on self soft tissue to the perineum.    Person(s) Educated Patient   Methods Explanation;Demonstration;Verbal cues;Handout   Comprehension Returned demonstration;Verbalized understanding          PT Short Term Goals - 09/25/14 0856    PT SHORT TERM GOAL #1   Title return demonstration of flexibility exercises   Time 4   Period Weeks   Status Achieved   PT SHORT TERM GOAL #2   Title sit with pain decreased >/= 25%   Time 4   Period Weeks   Status Achieved   PT SHORT TERM GOAL #3   Title stand with pain decreased >/= 25%   Time 4   Period Weeks   Status Achieved   PT SHORT TERM GOAL #4   Title understand correct sitting posture to  decreased pelvic floor spasms   Time 4   Period Weeks   Status Achieved           PT Long Term Goals - 10/02/14 1694    PT LONG TERM GOAL #1   Title sit with pain decreased >/= 75%   Time 12   Period Weeks   Status On-going  decreased by 30%   PT LONG TERM GOAL #2   Title stand with pain decreased >/= 75%   Time 12   Period Weeks   Status On-going  50%   PT LONG TERM GOAL #3   Title Marinoff scale 1/3 so she is able to have intercourse   Time 12   Period Weeks   Status On-going  not able to have intercourse   PT LONG TERM GOAL #4   Title return to exercise with correct body mechanics due to pain decreased >/= 75%   Time 12   Period Weeks   Status On-going  yoga and walking               Plan - 10/04/14 0851    Clinical Impression Statement Patient always has pain with sitting but now there are  times without pain in different positions. Patient is 80% better.  Patient has trigger points in bil. levator ani and obturator internist.    Pt will benefit from skilled therapeutic intervention in order to improve on the following deficits Decreased range of motion;Difficulty walking;Impaired flexibility;Postural dysfunction;Decreased endurance;Decreased activity tolerance;Increased fascial restricitons;Pain;Increased muscle spasms;Decreased mobility;Decreased strength   Rehab Potential Good   PT Frequency 2x / week   PT Duration 12 weeks   PT Treatment/Interventions Moist Heat;Therapeutic activities;Patient/family education;Passive range of motion;Therapeutic exercise;Biofeedback;Manual techniques;Neuromuscular re-education;Cryotherapy;Electrical Stimulation;Functional mobility training   PT Next Visit Plan internal soft tissue work   PT [facility] current HEP   Consulted and Agree with Plan of Care Patient        Problem List Patient Active Problem List   Diagnosis Date Noted  . Abdominal pain, unspecified site 03/28/2014  . GERD (gastroesophageal reflux disease) 04/18/2013  . Routine general medical examination at a health care facility 05/21/2012  . SEIZURE DISORDER 04/18/2010    Danetta Prom,PT 10/04/2014, 9:26 AM  [facility] [address], [place], [place], [address] Phone: [phone]   Fax:  [phone]

## 2014-10-04 NOTE — Patient Instructions (Signed)
Slow Contraction: Gravity Eliminated (Hook-Lying)   Lie with hips and knees bent. Slowly squeeze pelvic floor for 3___ seconds. Rest for _5__ seconds. Repeat _5__ times. Do _3__ times a day. Can do in sitting   Copyright  VHI. All rights reserved.    Bouncing With Bracing (Sitting)   Sit on ball. Find neutral spine. Tighten pelvic floor and abdomen and hold. Gently bounce up and down for _10_x Repeat 1___ times. Do _1__ times a day.  Copyright  VHI. All rights reserved.  Pelvic Tilt (Sitting)   Sit on ball. Tighten pelvic floor and hold. Roll ball slightly forward by tilting pelvis backwards. Relax. Repeat _10__ times. Do _1__ times a day. Advanced: Continue to hold squeeze through repetitions.  Copyright  VHI. All rights reserved.  Side Tilt (Sitting)   Sit on ball. Tighten pelvic floor and hold. Roll ball slightly to one side by tilting pelvis the opposite direction. Repeat to other side. Relax. Repeat _10__ times. Do 1___ times a day. Advanced: Continue to hold squeeze through repetitions.  Copyright  VHI. All rights reserved.  Diagonal (Sitting)   Sit on ball. Tighten pelvic floor and hold. Guiding with legs, move ball diagonally to left side. Relax. Repeat _10__ times. Do _1__ times a day. Alternate: Form a "V" by moving diagonal to one side then to other. Advanced: Continue to hold squeeze through repetitions.   Copyright  VHI. All rights reserved.

## 2014-10-10 ENCOUNTER — Ambulatory Visit: Payer: Federal, State, Local not specified - PPO | Admitting: Physical Therapy

## 2014-10-10 ENCOUNTER — Encounter: Payer: Self-pay | Admitting: Physical Therapy

## 2014-10-10 DIAGNOSIS — N8184 Pelvic muscle wasting: Secondary | ICD-10-CM | POA: Diagnosis not present

## 2014-10-10 DIAGNOSIS — R198 Other specified symptoms and signs involving the digestive system and abdomen: Secondary | ICD-10-CM

## 2014-10-10 DIAGNOSIS — M6289 Other specified disorders of muscle: Secondary | ICD-10-CM

## 2014-10-10 DIAGNOSIS — K5902 Outlet dysfunction constipation: Secondary | ICD-10-CM

## 2014-10-10 NOTE — Patient Instructions (Addendum)
Slow Contraction: Gravity Eliminated (Hook-Lying)   Lie with hips and knees bent. Slowly squeeze pelvic floor for _5__ seconds. Rest for __5_ seconds. Repeat __10_ times. Do _2__ times a day.   Copyright  VHI. All rights reserved.  Over and Under   Kneel with both palms on top of ball, arms extended. Lift one palm off ball and reach arm under torso to achieve a stretch. Hold _15__ seconds. Do 2___ sets of _1__ repetitions. Both sides  Copyright  VHI. All rights reserved.  Kneeling Upper Torso Stretch   Kneel behind ball, palms on sides of ball. Roll ball from side to side, allowing bottom arm to bend. Hold _15__ seconds. Do 2___ sets of _1__ repetitions. Do both sides  Copyright  VHI. All rights reserved.  Leg Rock (Supine)   Lie flat with calves supported by ball. Squeeze pelvic floor and hold. Rock ball to one side, then the other. Relax. Repeat 10___ times. Do _1__ times a day. Advanced: Hold squeeze through all repetitions. Can do with legs straight or bent.     Copyright  VHI. All rights reserved.  Patient able to return demonstration and perform correctly.

## 2014-10-10 NOTE — Therapy (Signed)
[facility] [address] [place], [place], [address] Phone: [phone]   Fax:  [phone]  Physical Therapy Treatment  Patient Details  Name: Michele Mills MRN: [id number] Date of Birth: [birth year] Referring Provider:  Yvette Rack, MD  Encounter Date: 10/10/2014      PT End of Session - 10/10/14 0923    Visit Number 6   Date for PT Re-Evaluation 11/27/14   PT Start Time 0845   PT Stop Time 0930   PT Time Calculation (min) 45 min   Equipment Utilized During Treatment --  Physioball   Activity Tolerance Patient tolerated treatment well   Behavior During Therapy [facility] for tasks assessed/performed      Past Medical History  Diagnosis Date  . Seizure disorder     History reviewed. No pertinent past surgical history.  There were no vitals filed for this visit.  Visit Diagnosis:  Spastic pelvic floor syndrome  PFD (pelvic floor dysfunction)      Subjective Assessment - 10/10/14 0846    Subjective I am feeling much better. Patient reports pain has decreased by 95%.  The muscles feel softer when I touch them. When patient stretches she feels less internally. the ball exercises are getting easier.    Pertinent History Patient has scoliosis and has been seeing a chiropractor periodically   How long can you sit comfortably? 1 1/2 hour   How long can you stand comfortably? stand for 1 hour   How long can you walk comfortably? No diffilculty   Patient Stated Goals reduce pain in rectal and vaginal.  Get back to the gym   Currently in Pain? Yes   Pain Score 1    Pain Location Pelvis   Pain Orientation Right;Left   Pain Descriptors / Indicators Throbbing;Discomfort   Pain Type Chronic pain   Pain Radiating Towards radiate to low back   Pain Onset More than a month ago   Pain Frequency Intermittent   Aggravating Factors  sitting   Pain Relieving Factors walking and gentle stretching   Effect of Pain on Daily  Activities sitting   Multiple Pain Sites No                    Pelvic Floor Special Questions - 10/10/14 0001    Pelvic Floor Internal Exam Patient confirms identification and approves physical therapist to perform soft tissue work and assess strength    Strength weak squeeze, no lift   Strength # of reps 3   Strength # of seconds 3           [facility] Adult PT Treatment/Exercise - 10/10/14 0001    Manual Therapy   Manual Therapy Internal Pelvic Floor   Massage Patient confirms identification and approved  PT to perform internal soft tissue work to pelvic floor, bil. obturatoru internist with different hip positions and left levator ani wiht contract relax                PT Education - 10/10/14 0927    Education provided Yes   Education Details pelvic floor strength, physioball exercises   Person(s) Educated Patient   Methods Explanation;Demonstration;Tactile cues;Verbal cues;Handout   Comprehension Returned demonstration;Verbalized understanding          PT Short Term Goals - 09/25/14 0856    PT SHORT TERM GOAL #1   Title return demonstration of flexibility exercises   Time 4   Period Weeks   Status Achieved  PT SHORT TERM GOAL #2   Title sit with pain decreased >/= 25%   Time 4   Period Weeks   Status Achieved   PT SHORT TERM GOAL #3   Title stand with pain decreased >/= 25%   Time 4   Period Weeks   Status Achieved   PT SHORT TERM GOAL #4   Title understand correct sitting posture to decreased pelvic floor spasms   Time 4   Period Weeks   Status Achieved           PT Long Term Goals - 10/10/14 0849    PT LONG TERM GOAL #1   Title sit with pain decreased >/= 75%   Time 12   Period Weeks   Status --  50%   PT LONG TERM GOAL #2   Title stand with pain decreased >/= 75%   Time 12   Period Weeks   Status Achieved   PT LONG TERM GOAL #3   Title Marinoff scale 1/3 so she is able to have intercourse   Time 12   Period Weeks    Status Achieved  0/3   PT LONG TERM GOAL #4   Title return to exercise with correct body mechanics due to pain decreased >/= 75%   Time 12   Period Weeks   Status --  run walk but not running               Plan - 10/10/14 0929    Clinical Impression Statement Patient has met all STG's.  Patient has fewer trigger points in the pelvic floor.  Patient has decreased strength of the pelvic floor.    Pt will benefit from skilled therapeutic intervention in order to improve on the following deficits Decreased range of motion;Difficulty walking;Impaired flexibility;Postural dysfunction;Decreased endurance;Decreased activity tolerance;Increased fascial restricitons;Pain;Increased muscle spasms;Decreased mobility;Decreased strength   PT Frequency 2x / week   PT Duration 12 weeks   PT Treatment/Interventions Moist Heat;Therapeutic activities;Patient/family education;Passive range of motion;Therapeutic exercise;Biofeedback;Manual techniques;Neuromuscular re-education;Cryotherapy;Electrical Stimulation;Functional mobility training   PT Next Visit Plan internal soft tissue work.  See patient in 2 weeks.   PT Home Exercise Plan current HEP   Consulted and Agree with Plan of Care Patient        Problem List Patient Active Problem List   Diagnosis Date Noted  . Abdominal pain, unspecified site 03/28/2014  . GERD (gastroesophageal reflux disease) 04/18/2013  . Routine general medical examination at a health care facility 05/21/2012  . SEIZURE DISORDER 04/18/2010    GRAY,CHERYL,PT 10/10/2014, 9:31 AM  [facility] [address], [place], [place], [address] Phone: [phone]   Fax:  [phone]

## 2014-10-12 ENCOUNTER — Encounter: Payer: Federal, State, Local not specified - PPO | Admitting: Physical Therapy

## 2014-10-12 DIAGNOSIS — K644 Residual hemorrhoidal skin tags: Secondary | ICD-10-CM | POA: Insufficient documentation

## 2014-10-23 ENCOUNTER — Ambulatory Visit: Payer: Federal, State, Local not specified - PPO | Admitting: Physical Therapy

## 2014-10-23 ENCOUNTER — Encounter: Payer: Self-pay | Admitting: Physical Therapy

## 2014-10-23 DIAGNOSIS — N8184 Pelvic muscle wasting: Secondary | ICD-10-CM | POA: Diagnosis not present

## 2014-10-23 DIAGNOSIS — M6289 Other specified disorders of muscle: Secondary | ICD-10-CM

## 2014-10-23 DIAGNOSIS — R198 Other specified symptoms and signs involving the digestive system and abdomen: Secondary | ICD-10-CM

## 2014-10-23 DIAGNOSIS — M25559 Pain in unspecified hip: Secondary | ICD-10-CM

## 2014-10-23 NOTE — Therapy (Signed)
[facility] [address], [place], [place], [address] Phone: [phone]   Fax:  [phone]  Physical Therapy Treatment  Patient Details  Name: Michele Mills MRN: [id number] Date of Birth: 03-Jul-1975 Referring Provider:  Yvette Rack, MD  Encounter Date: 10/23/2014      PT End of Session - 10/23/14 0843    Visit Number 7   Date for PT Re-Evaluation 11/27/14   PT Start Time 0800   PT Stop Time 0900   PT Time Calculation (min) 60 min   Activity Tolerance Patient tolerated treatment well   Behavior During Therapy [facility] for tasks assessed/performed      Past Medical History  Diagnosis Date  . Seizure disorder     History reviewed. No pertinent past surgical history.  There were no vitals filed for this visit.  Visit Diagnosis:  Spastic pelvic floor syndrome  PFD (pelvic floor dysfunction)  Pain in joint, pelvic region and thigh, unspecified laterality      Subjective Assessment - 10/23/14 0806    Subjective I feel terrible.  I had to travel and all of the sitting increased my pelvic pain. I am on my cycle.   Pertinent History Patient has scoliosis and has been seeing a chiropractor periodically   Limitations Sitting;Standing   How long can you sit comfortably? 15 min   How long can you stand comfortably? 30 min   How long can you walk comfortably? No diffilculty   Patient Stated Goals reduce pain in rectal and vaginal.  Get back to the gym   Currently in Pain? Yes   Pain Score 5    Pain Location Pelvis  lower abdomen, back   Pain Orientation Right;Left;Lower   Pain Descriptors / Indicators Throbbing;Spasm   Pain Type Chronic pain   Pain Onset More than a month ago   Pain Frequency Constant   Aggravating Factors  sitting   Pain Relieving Factors moving, exercise, stretching   Multiple Pain Sites No                         [facility] Adult PT Treatment/Exercise - 10/23/14 0001    Modalities   Modalities Electrical Stimulation;Moist Heat;Ultrasound   Moist Heat Therapy   Number Minutes Moist Heat 20 Minutes   Moist Heat Location --  lumbar   Electrical Stimulation   Electrical Stimulation Location lumbar supine   Electrical Stimulation Action IFC   Electrical Stimulation Parameters 80-150hz    Electrical Stimulation Goals Pain   Ultrasound   Ultrasound Location left lumbar sacral area  prone   Ultrasound Parameters 100%, 11mhz, 1.2 w/cm2, 8 min   Ultrasound Goals Pain   Manual Therapy   Manual Therapy Joint mobilization;Massage   Joint Mobilization P-A movement onf L1-L5 grace 3   Massage to bil. lumbar paraspinals, SI joint, and gluteals                PT Education - 10/23/14 0843    Education provided No          PT Short Term Goals - 09/25/14 0856    PT SHORT TERM GOAL #1   Title return demonstration of flexibility exercises   Time 4   Period Weeks   Status Achieved   PT SHORT TERM GOAL #2   Title sit with pain decreased >/= 25%   Time 4   Period Weeks   Status Achieved   PT SHORT TERM GOAL #3  Title stand with pain decreased >/= 25%   Time 4   Period Weeks   Status Achieved   PT SHORT TERM GOAL #4   Title understand correct sitting posture to decreased pelvic floor spasms   Time 4   Period Weeks   Status Achieved           PT Long Term Goals - 10/23/14 0849    PT LONG TERM GOAL #1   Title sit with pain decreased >/= 75%   Time 12   Period Weeks   Status --  due to recent flare up   PT LONG TERM GOAL #4   Title return to exercise with correct body mechanics due to pain decreased >/= 75%   Time 12   Period Weeks   Status --  no due to recent flare up               Plan - 10/23/14 0844    Clinical Impression Statement Patient is a 39 year old female with lumbar and pelvic floor pain.  Patient traveled to [place] which required alot of sitting that flared up her pain.  Patient has decreased mobility of  Lumbar vertebrae and spasms in  bil. quadratus, Piriformis, obturator internist and levator. Patient is having difficulty with all activities due to the muscle spasms and pain.     Pt will benefit from skilled therapeutic intervention in order to improve on the following deficits Decreased range of motion;Difficulty walking;Impaired flexibility;Postural dysfunction;Decreased endurance;Decreased activity tolerance;Increased fascial restricitons;Pain;Increased muscle spasms;Decreased mobility;Decreased strength   Rehab Potential Good   PT Frequency 2x / week   PT Duration 12 weeks   PT Next Visit Plan internal soft tissue work, back stabilization, ultrasound   PT Home Exercise Plan current HEP   Consulted and Agree with Plan of Care Patient        Problem List Patient Active Problem List   Diagnosis Date Noted  . Abdominal pain, unspecified site 03/28/2014  . GERD (gastroesophageal reflux disease) 04/18/2013  . Routine general medical examination at a health care facility 05/21/2012  . SEIZURE DISORDER 04/18/2010    Pearce Littlefield,PT 10/23/2014, 8:50 AM  [facility] [address], [place], [place], [address] Phone: [phone]   Fax:  [phone]

## 2014-11-01 ENCOUNTER — Ambulatory Visit: Payer: Federal, State, Local not specified - PPO | Admitting: Physical Therapy

## 2014-11-02 ENCOUNTER — Ambulatory Visit: Payer: Federal, State, Local not specified - PPO | Attending: Obstetrics and Gynecology | Admitting: Physical Therapy

## 2014-11-02 ENCOUNTER — Encounter: Payer: Self-pay | Admitting: Physical Therapy

## 2014-11-02 DIAGNOSIS — M25559 Pain in unspecified hip: Secondary | ICD-10-CM | POA: Diagnosis not present

## 2014-11-02 DIAGNOSIS — G40909 Epilepsy, unspecified, not intractable, without status epilepticus: Secondary | ICD-10-CM | POA: Diagnosis not present

## 2014-11-02 DIAGNOSIS — R1032 Left lower quadrant pain: Secondary | ICD-10-CM | POA: Insufficient documentation

## 2014-11-02 DIAGNOSIS — M419 Scoliosis, unspecified: Secondary | ICD-10-CM | POA: Diagnosis not present

## 2014-11-02 DIAGNOSIS — K219 Gastro-esophageal reflux disease without esophagitis: Secondary | ICD-10-CM | POA: Insufficient documentation

## 2014-11-02 DIAGNOSIS — N8184 Pelvic muscle wasting: Secondary | ICD-10-CM | POA: Diagnosis not present

## 2014-11-02 DIAGNOSIS — R198 Other specified symptoms and signs involving the digestive system and abdomen: Secondary | ICD-10-CM

## 2014-11-02 DIAGNOSIS — M62838 Other muscle spasm: Secondary | ICD-10-CM | POA: Diagnosis not present

## 2014-11-02 DIAGNOSIS — M6289 Other specified disorders of muscle: Secondary | ICD-10-CM

## 2014-11-02 NOTE — Therapy (Signed)
[facility] [address] [place], [place], [address] Phone: [phone]   Fax:  [phone]  Physical Therapy Treatment  Patient Details  Name: Michele Mills MRN: [id number] Date of Birth: Nov 06, 1975 Referring Provider:  Yvette Rack, MD  Encounter Date: 11/02/2014      PT End of Session - 11/02/14 1053    Visit Number 8   Date for PT Re-Evaluation 11/27/14   PT Start Time 6147   PT Stop Time 1055   PT Time Calculation (min) 40 min   Activity Tolerance Patient tolerated treatment well   Behavior During Therapy [facility] for tasks assessed/performed      Past Medical History  Diagnosis Date  . Seizure disorder     History reviewed. No pertinent past surgical history.  There were no vitals filed for this visit.  Visit Diagnosis:  Spastic pelvic floor syndrome  PFD (pelvic floor dysfunction)      Subjective Assessment - 11/02/14 1022    Subjective Patient reports she feels better but has a yeast infection.  MD said the cyst is better but I have another yeast infection.    Pertinent History Patient has scoliosis and has been seeing a chiropractor periodically   How long can you sit comfortably? 2 hours with a break every 1/2 hour   How long can you stand comfortably? 1 horu   How long can you walk comfortably? No diffilculty   Patient Stated Goals reduce pain in rectal and vaginal.  Get back to the gym   Currently in Pain? Yes   Pain Score 3    Pain Location Pelvis  just above the pubic bone   Pain Orientation Lower   Pain Descriptors / Indicators Dull;Aching   Pain Type Chronic pain   Pain Onset More than a month ago   Pain Frequency Constant   Aggravating Factors  sitting   Pain Relieving Factors not sitting   Multiple Pain Sites No                         [facility] Adult PT Treatment/Exercise - 11/02/14 0001    Posture/Postural Control   Posture Comments Patient leans to the right  while using the computer mouse, VC to contract the abdominals to stabilize core while she is working on the computer and equal weight on bil. buttocks.    Modalities   Modalities Ultrasound   Ultrasound   Ultrasound Location suprapubic, hookly   Ultrasound Parameters 50%, 1 mhz, 0.8w/cm2, 8 min   Manual Therapy   Manual Therapy Massage;Myofascial release   Massage to left lower quadrant, left diaphgram, left levator ani with left hip movement                PT Education - 11/02/14 1052    Education provided Yes   Education Details sitting with equal pressure on bil. buttocks   Person(s) Educated Patient   Methods Explanation;Demonstration;Verbal cues   Comprehension Returned demonstration;Verbalized understanding          PT Short Term Goals - 09/25/14 0856    PT SHORT TERM GOAL #1   Title return demonstration of flexibility exercises   Time 4   Period Weeks   Status Achieved   PT SHORT TERM GOAL #2   Title sit with pain decreased >/= 25%   Time 4   Period Weeks   Status Achieved   PT SHORT TERM GOAL #3   Title stand with  pain decreased >/= 25%   Time 4   Period Weeks   Status Achieved   PT SHORT TERM GOAL #4   Title understand correct sitting posture to decreased pelvic floor spasms   Time 4   Period Weeks   Status Achieved           PT Long Term Goals - 11/02/14 1056    PT LONG TERM GOAL #1   Title sit with pain decreased >/= 75%   Time 12   Period Weeks   Status On-going   PT LONG TERM GOAL #2   Title stand with pain decreased >/= 75%   Time 12   Period Weeks   Status Achieved   PT LONG TERM GOAL #3   Title Marinoff scale 1/3 so she is able to have intercourse   Time 12   Period Weeks   Status Achieved   PT LONG TERM GOAL #4   Title return to exercise with correct body mechanics due to pain decreased >/= 75%   Time 12   Period Weeks   Status On-going  working on her running, 10 min.                Plan - 11/02/14 1053     Clinical Impression Statement Patient reports she is 80% better since last treatment.  Patient is now able to run 10 min. and her pelvic floor does not feel like it is hanging.  Patient pain returns with sitting going from the coccyx bone to the pubic bone on the left side. Patient has not met any goals this week. Palpable tenderness located in left diaphragm and left lower quadrant.    Pt will benefit from skilled therapeutic intervention in order to improve on the following deficits Decreased range of motion;Difficulty walking;Impaired flexibility;Postural dysfunction;Decreased endurance;Decreased activity tolerance;Increased fascial restricitons;Pain;Increased muscle spasms;Decreased mobility;Decreased strength   Rehab Potential Good   PT Frequency 2x / week   PT Duration 12 weeks   PT Treatment/Interventions Moist Heat;Therapeutic activities;Patient/family education;Passive range of motion;Therapeutic exercise;Biofeedback;Manual techniques;Neuromuscular re-education;Cryotherapy;Electrical Stimulation;Functional mobility training   PT Next Visit Plan soft tissue work, ultrasoud, abdominal bracing with arm movement   PT Home Exercise Plan current HEP   Consulted and Agree with Plan of Care Patient        Problem List Patient Active Problem List   Diagnosis Date Noted  . Abdominal pain, unspecified site 03/28/2014  . GERD (gastroesophageal reflux disease) 04/18/2013  . Routine general medical examination at a health care facility 05/21/2012  . SEIZURE DISORDER 04/18/2010    GRAY,CHERYL,PT 11/02/2014, 10:58 AM  [facility] [address] [place], [place], [address] Phone: [phone]   Fax:  [phone]

## 2014-11-06 ENCOUNTER — Ambulatory Visit: Payer: Federal, State, Local not specified - PPO | Admitting: Physical Therapy

## 2014-11-06 DIAGNOSIS — K5902 Outlet dysfunction constipation: Secondary | ICD-10-CM

## 2014-11-06 DIAGNOSIS — M6289 Other specified disorders of muscle: Secondary | ICD-10-CM

## 2014-11-06 DIAGNOSIS — N8184 Pelvic muscle wasting: Secondary | ICD-10-CM | POA: Diagnosis not present

## 2014-11-06 DIAGNOSIS — R198 Other specified symptoms and signs involving the digestive system and abdomen: Secondary | ICD-10-CM

## 2014-11-06 DIAGNOSIS — M25559 Pain in unspecified hip: Secondary | ICD-10-CM

## 2014-11-06 NOTE — Therapy (Signed)
[facility] [address], [place], [place], [address] Phone: [phone]   Fax:  [phone]  Physical Therapy Treatment  Patient Details  Name: Michele Mills MRN: [id number] Date of Birth: 07-Sep-1975 Referring Provider:  Yvette Rack, MD  Encounter Date: 11/06/2014      PT End of Session - 11/06/14 1021    Visit Number 9   Date for PT Re-Evaluation 11/27/14   PT Start Time 1020   PT Stop Time 1100   PT Time Calculation (min) 40 min   Activity Tolerance Patient tolerated treatment well   Behavior During Therapy [facility] for tasks assessed/performed      Past Medical History  Diagnosis Date  . Seizure disorder     No past surgical history on file.  There were no vitals filed for this visit.  Visit Diagnosis:  Spastic pelvic floor syndrome  PFD (pelvic floor dysfunction)  Pain in joint, pelvic region and thigh, unspecified laterality      Subjective Assessment - 11/06/14 1022    Subjective Patient reports she is feeling good.  Patient reports she had a nerve pain yesterday with burning and needles in pelvic floor. Yesterday I could not sit at all due to being tendern and sore.    Pertinent History Patient has scoliosis and has been seeing a chiropractor periodically   Limitations Sitting;Standing   How long can you sit comfortably? 2 hours with a break every 1/2 hour   How long can you stand comfortably? 1 hour   How long can you walk comfortably? No diffilculty   Patient Stated Goals reduce pain in rectal and vaginal.  Get back to the gym   Currently in Pain? Yes   Pain Score 1   yesterday 7/10   Pain Location Pelvis   Pain Orientation Lower   Pain Descriptors / Indicators Discomfort   Pain Type Chronic pain   Pain Onset More than a month ago   Pain Frequency Intermittent   Aggravating Factors  sitting   Pain Relieving Factors standing and moving   Effect of Pain on Daily Activities sitting   Multiple Pain Sites No            [facility] PT Assessment - 11/06/14 0001    Posture/Postural Control   Posture Comments Patient was instructed on correct breathing by using her hand and theraband to expand the lower rib cage and relax the pelvic floor.    Palpation   Palpation decreased expantion of lower rib cage with bucket handle movement, Pelvis in correctalignment                     [facility] Adult PT Treatment/Exercise - 11/06/14 0001    Manual Therapy   Joint Mobilization rib mobilization to expand right lower rib cage.   Massage to bil. pelvic floor outside colthes with leg pull                PT Education - 11/06/14 1058    Education provided Yes   Education Details lower abdominal exercises, quadruped with extremity lift   Person(s) Educated Patient   Methods Explanation;Demonstration;Tactile cues;Verbal cues;Handout   Comprehension Returned demonstration;Tactile cues required;Verbalized understanding          PT Short Term Goals - 09/25/14 0856    PT SHORT TERM GOAL #1   Title return demonstration of flexibility exercises   Time 4   Period Weeks   Status Achieved   PT SHORT TERM  GOAL #2   Title sit with pain decreased >/= 25%   Time 4   Period Weeks   Status Achieved   PT SHORT TERM GOAL #3   Title stand with pain decreased >/= 25%   Time 4   Period Weeks   Status Achieved   PT SHORT TERM GOAL #4   Title understand correct sitting posture to decreased pelvic floor spasms   Time 4   Period Weeks   Status Achieved           PT Long Term Goals - 11/06/14 1024    PT LONG TERM GOAL #1   Title sit with pain decreased >/= 75%   Time 12   Period Weeks   Status On-going  overall pain decreased by 60%   PT LONG TERM GOAL #4   Title return to exercise with correct body mechanics due to pain decreased >/= 75%   Time 12   Period Weeks   Status On-going  not at full exercise               Plan - 11/06/14 1059    Clinical  Impression Statement Patient is a 39 year old female with pelvic pain in sitting only.  Patient right rib cage does not expand in sitting.  Patient has difficulty with keeping her pelvis in correct alignment while performing stabilization exercises. Patient reports overall pain in sitting is 60% better. Patient reports no suprapubic pain but pain on bilateral sides of pelvic floor from the tailbone to pubic bone. Patient has not met goals today but is improving with pain with sitting.    Pt will benefit from skilled therapeutic intervention in order to improve on the following deficits Decreased range of motion;Difficulty walking;Impaired flexibility;Postural dysfunction;Decreased endurance;Decreased activity tolerance;Increased fascial restricitons;Pain;Increased muscle spasms;Decreased mobility;Decreased strength   Rehab Potential Good   PT Frequency 2x / week   PT Duration 12 weeks   PT Next Visit Plan soft tissue work, core strengthening   PT Home Exercise Plan current HEP   Consulted and Agree with Plan of Care Patient        Problem List Patient Active Problem List   Diagnosis Date Noted  . Abdominal pain, unspecified site 03/28/2014  . GERD (gastroesophageal reflux disease) 04/18/2013  . Routine general medical examination at a health care facility 05/21/2012  . SEIZURE DISORDER 04/18/2010    GRAY,CHERYL,PT 11/06/2014, 11:02 AM  [facility] [address] [place], [place], [address] Phone: [phone]   Fax:  [phone]

## 2014-11-06 NOTE — Patient Instructions (Signed)
Bracing With Knee Fallout (Hook-Lying)   With neutral spine, tighten pelvic floor and abdominals and hold. Alternating legs, drop knee out to side. Keep opposite hip still. Repeat _10__ times. Do __1_ times a day.   Copyright  VHI. All rights reserved.   Bracing With Leg March (Hook-Lying)   With neutral spine, tighten pelvic floor and abdominals and hold. Alternating legs, lift foot _6__ inches and return to floor. Repeat _10__ times. Do _1__ times a day.   Copyright  VHI. All rights reserved.   Bracing With Arm Lift (Hook-Lying)   With neutral spine, tighten pelvic floor and abdominals and hold. Alternating arms, raise over head and return to side. Repeat _10__ times. Do 1___ times a day. Do not left lower rib cage Copyright  VHI. All rights reserved.  Bracing With Bridging (Hook-Lying)   With neutral spine, tighten pelvic floor and abdominals and hold. Lift bottom. Repeat _10__ times. Do _1__ times a day. Do not let pelvis wiggle.  Copyright  VHI. All rights reserved.  Bracing With Arm Raise (Quadruped)   On hands and knees find neutral spine. Tighten pelvic floor and abdominals and hold. Alternately lift arm to shoulder level. Repeat _10__ times. Do _1__ times a day. Do not let left side go down when lifting the left arm.   Copyright  VHI. All rights reserved.  Bracing With Leg Raise (Quadruped)   On hands and knees find neutral spine. Tighten pelvic floor and abdominals and hold. Alternating legs, straighten and lift to hip level. Repeat _10__ times. Do _1__ times a day. Do not let hips twist.   Copyright  VHI. All rights reserved.  Patient able to return demonstration correctly with above exercises.

## 2014-11-09 ENCOUNTER — Encounter: Payer: Self-pay | Admitting: Physical Therapy

## 2014-11-09 ENCOUNTER — Other Ambulatory Visit: Payer: Self-pay | Admitting: Obstetrics and Gynecology

## 2014-11-09 ENCOUNTER — Ambulatory Visit: Payer: Federal, State, Local not specified - PPO | Admitting: Physical Therapy

## 2014-11-09 DIAGNOSIS — N8184 Pelvic muscle wasting: Secondary | ICD-10-CM | POA: Diagnosis not present

## 2014-11-09 DIAGNOSIS — M6289 Other specified disorders of muscle: Secondary | ICD-10-CM

## 2014-11-09 DIAGNOSIS — R102 Pelvic and perineal pain: Secondary | ICD-10-CM

## 2014-11-09 DIAGNOSIS — M25559 Pain in unspecified hip: Secondary | ICD-10-CM

## 2014-11-09 DIAGNOSIS — R198 Other specified symptoms and signs involving the digestive system and abdomen: Secondary | ICD-10-CM

## 2014-11-09 DIAGNOSIS — N946 Dysmenorrhea, unspecified: Secondary | ICD-10-CM

## 2014-11-09 NOTE — Therapy (Signed)
[facility] [address] [place], [place], [address] Phone: [phone]   Fax:  [phone]  Physical Therapy Treatment  Patient Details  Name: Michele Mills MRN: [id number] Date of Birth: Apr 03, 1976 Referring Provider:  Yvette Rack, MD  Encounter Date: 11/09/2014      PT End of Session - 11/09/14 1055    Visit Number 10   Date for PT Re-Evaluation 11/27/14   PT Start Time 2542   PT Stop Time 1055   PT Time Calculation (min) 40 min   Activity Tolerance Patient tolerated treatment well   Behavior During Therapy [facility] for tasks assessed/performed      Past Medical History  Diagnosis Date  . Seizure disorder     History reviewed. No pertinent past surgical history.  There were no vitals filed for this visit.  Visit Diagnosis:  Spastic pelvic floor syndrome  PFD (pelvic floor dysfunction)  Pain in joint, pelvic region and thigh, unspecified laterality      Subjective Assessment - 11/09/14 1023    Subjective I feel better from last visit with breathing in sitting.    Pertinent History Patient has scoliosis and has been seeing a chiropractor periodically   Limitations Sitting;Standing   How long can you sit comfortably? 2 hours with a break every 1/2 hour   How long can you stand comfortably? 1 hour   How long can you walk comfortably? No diffilculty   Patient Stated Goals reduce pain in rectal and vaginal.  Get back to the gym   Currently in Pain? Yes   Pain Score 2    Pain Location Pelvis   Pain Orientation Lower   Pain Descriptors / Indicators Throbbing   Pain Type Chronic pain   Pain Onset More than a month ago   Pain Frequency Intermittent   Aggravating Factors  sitting   Pain Relieving Factors standing and moving   Effect of Pain on Daily Activities sitting   Multiple Pain Sites No                         [facility] Adult PT Treatment/Exercise - 11/09/14 0001    Posture/Postural Control   Posture Comments PT verbally instructed patient on how to sit with pressure off the preineum using towel rolls   Manual Therapy   Manual Therapy Massage;Myofascial release   Massage to bilateral pelvic floor muscles, posterior hip and coccygeus with hip movements                PT Education - 11/09/14 1037    Education provided Yes   Education Details rock the  baby pose, lizard pose,Happy Baby pose, and supine twist pose; sitting position with two towel rolls under the ischium   Person(s) Educated Patient;Spouse   Methods Explanation;Demonstration;Verbal cues;Handout   Comprehension Returned demonstration;Verbalized understanding          PT Short Term Goals - 09/25/14 0856    PT SHORT TERM GOAL #1   Title return demonstration of flexibility exercises   Time 4   Period Weeks   Status Achieved   PT SHORT TERM GOAL #2   Title sit with pain decreased >/= 25%   Time 4   Period Weeks   Status Achieved   PT SHORT TERM GOAL #3   Title stand with pain decreased >/= 25%   Time 4   Period Weeks   Status Achieved   PT SHORT TERM GOAL #4  Title understand correct sitting posture to decreased pelvic floor spasms   Time 4   Period Weeks   Status Achieved           PT Long Term Goals - 11/06/14 1024    PT LONG TERM GOAL #1   Title sit with pain decreased >/= 75%   Time 12   Period Weeks   Status On-going  overall pain decreased by 60%   PT LONG TERM GOAL #4   Title return to exercise with correct body mechanics due to pain decreased >/= 75%   Time 12   Period Weeks   Status On-going  not at full exercise               Plan - 11/09/14 1056    Clinical Impression Statement Patient has less pain when sitting with no pressure on the perineum using towel rolls.  Patient has trigger points and thickness in bil. obturator internist and levator ani.  Patient is more limited in left hip and pelvic musculature.    Pt will benefit from  skilled therapeutic intervention in order to improve on the following deficits Decreased range of motion;Difficulty walking;Impaired flexibility;Postural dysfunction;Decreased endurance;Decreased activity tolerance;Increased fascial restricitons;Pain;Increased muscle spasms;Decreased mobility;Decreased strength   Rehab Potential Good   PT Frequency 2x / week   PT Duration 12 weeks   PT Treatment/Interventions Moist Heat;Therapeutic activities;Patient/family education;Passive range of motion;Therapeutic exercise;Biofeedback;Manual techniques;Neuromuscular re-education;Cryotherapy;Electrical Stimulation;Functional mobility training   PT Next Visit Plan soft tissue work, core strengthening   PT Home Exercise Plan current HEP   Consulted and Agree with Plan of Care Patient        Problem List Patient Active Problem List   Diagnosis Date Noted  . Abdominal pain, unspecified site 03/28/2014  . GERD (gastroesophageal reflux disease) 04/18/2013  . Routine general medical examination at a health care facility 05/21/2012  . SEIZURE DISORDER 04/18/2010    GRAY,CHERYL,PT 11/09/2014, 10:58 AM  [facility] [address] [place], [place], [address] Phone: [phone]   Fax:  [phone]

## 2014-11-13 ENCOUNTER — Ambulatory Visit: Payer: Federal, State, Local not specified - PPO | Admitting: Physical Therapy

## 2014-11-13 ENCOUNTER — Ambulatory Visit
Admission: RE | Admit: 2014-11-13 | Discharge: 2014-11-13 | Disposition: A | Discharge status: Home / Self Care | Payer: Federal, State, Local not specified - PPO | Source: Ambulatory Visit | Attending: Obstetrics and Gynecology | Admitting: Obstetrics and Gynecology

## 2014-11-13 ENCOUNTER — Encounter: Payer: Self-pay | Admitting: Physical Therapy

## 2014-11-13 DIAGNOSIS — N8184 Pelvic muscle wasting: Secondary | ICD-10-CM | POA: Diagnosis not present

## 2014-11-13 DIAGNOSIS — R198 Other specified symptoms and signs involving the digestive system and abdomen: Secondary | ICD-10-CM

## 2014-11-13 DIAGNOSIS — N946 Dysmenorrhea, unspecified: Secondary | ICD-10-CM

## 2014-11-13 DIAGNOSIS — R102 Pelvic and perineal pain: Secondary | ICD-10-CM

## 2014-11-13 DIAGNOSIS — M6289 Other specified disorders of muscle: Secondary | ICD-10-CM

## 2014-11-13 DIAGNOSIS — K5902 Outlet dysfunction constipation: Secondary | ICD-10-CM

## 2014-11-13 DIAGNOSIS — M25559 Pain in unspecified hip: Secondary | ICD-10-CM

## 2014-11-13 NOTE — Patient Instructions (Addendum)
Piriformis Stretch, Kneeling Pigeon Pose   From hands and knees, slide one leg backward, turn bent other leg out slightly to side. Resting weight on outside of bent leg, push up torso with arms. If extended hip is elevated place a blanket or towel underneath to relax hip. Hold _30__ seconds.  Repeat _2__ times per session. Do __1-2_ sessions per day.  Copyright  VHI. All rights reserved.  Arm / Leg Extension: Alternate (All-Fours)   Raise right arm and opposite leg. Do not arch neck. Repeat __10__ times per set. Do __1__ sets per session. Do _1___ sessions per day.  http://orth.exer.us/110   Copyright  VHI. All rights reserved.   External Rotation: Hip - Knees Apart (Side-Lying)   Lie on left side with hips and knees slightly bent, band tied just above knees. Pull knees apart. Hold for 1___ seconds. Rest for __1_ seconds. Repeat _15__ times. Do _1__ times a day. Then switch sides.    Copyright  VHI. All rights reserved.  Single Leg Circle   Lie on back, one leg bent, other leg straight up. Inhale, circling leg across body, and exhale while circling down and around to beginning. Maintain still pelvis; avoid rocking. Keep circle small. Repeat _10___ times clockwise, then counterclockwise. Repeat with other leg. Do __1__ sessions per day.  Copyright  VHI. All rights reserved.

## 2014-11-13 NOTE — Therapy (Signed)
[facility] [address] [place], [place], [address] Phone: [phone]   Fax:  [phone]  Physical Therapy Treatment  Patient Details  Name: Michele Mills MRN: [id number] Date of Birth: 07-03-1975 Referring Provider:  Yvette Rack, MD  Encounter Date: 11/13/2014      PT End of Session - 11/13/14 1048    Visit Number 11   Date for PT Re-Evaluation 11/27/14   PT Start Time 9518   PT Stop Time 1055   PT Time Calculation (min) 40 min   Activity Tolerance Patient tolerated treatment well   Behavior During Therapy [facility] for tasks assessed/performed      Past Medical History  Diagnosis Date  . Seizure disorder     History reviewed. No pertinent past surgical history.  There were no vitals filed for this visit.  Visit Diagnosis:  Spastic pelvic floor syndrome  PFD (pelvic floor dysfunction)  Pain in joint, pelvic region and thigh, unspecified laterality      Subjective Assessment - 11/13/14 1020    Subjective I felt really good after the last visit.    Pertinent History Patient has scoliosis and has been seeing a chiropractor periodically   Limitations Sitting;Standing   How long can you sit comfortably? 2 hours with a break every 1/2 hour   How long can you stand comfortably? 1 hour   How long can you walk comfortably? No diffilculty   Patient Stated Goals reduce pain in rectal and vaginal.  Get back to the gym   Currently in Pain? Yes   Pain Score 2    Pain Location Pelvis   Pain Orientation Lower   Pain Descriptors / Indicators Throbbing  tugging   Pain Type Chronic pain   Pain Onset More than a month ago   Pain Frequency Intermittent   Aggravating Factors  sitting   Pain Relieving Factors standing and moving   Effect of Pain on Daily Activities sitting   Multiple Pain Sites No            [facility] PT Assessment - 11/13/14 0001    AROM   Lumbar Flexion full   Flexibility   Piriformis  tight   Levator Ani tight   Obturator Internus tight   Palpation   Palpation pelvis in correct alignment                     [facility] Adult PT Treatment/Exercise - 11/13/14 0001    Lumbar Exercises: Stretches   Piriformis Stretch 3 reps;30 seconds  bil.    Lumbar Exercises: Standing   Functional Squats Other (comment)   Functional Squats Limitations hold 30 seconds 2 times deep   Lumbar Exercises: Supine   Other Supine Lumbar Exercises abdominal bracing with leg circles 10 time each way on bil. legs.    Lumbar Exercises: Sidelying   Clam 10 reps;1 second   Lumbar Exercises: Quadruped   Opposite Arm/Leg Raise Right arm/Left leg;Left arm/Right leg;10 reps;1 second   Manual Therapy   Soft tissue mobilization to bil. obturator internist , bil. levator ani, bil. quadratus in sidely with hip movements                PT Education - 11/13/14 1047    Education Details poroformis stretch, clamshell with green band, quadruped lift alternate extremity, leg circles   Person(s) Educated Patient   Methods Explanation;Demonstration;Tactile cues;Verbal cues;Handout   Comprehension Returned demonstration;Verbalized understanding  PT Short Term Goals - 09/25/14 0856    PT SHORT TERM GOAL #1   Title return demonstration of flexibility exercises   Time 4   Period Weeks   Status Achieved   PT SHORT TERM GOAL #2   Title sit with pain decreased >/= 25%   Time 4   Period Weeks   Status Achieved   PT SHORT TERM GOAL #3   Title stand with pain decreased >/= 25%   Time 4   Period Weeks   Status Achieved   PT SHORT TERM GOAL #4   Title understand correct sitting posture to decreased pelvic floor spasms   Time 4   Period Weeks   Status Achieved           PT Long Term Goals - 11/13/14 1058    PT LONG TERM GOAL #1   Title sit with pain decreased >/= 75%   Time 12   Period Weeks   Status On-going  60% decreased   PT LONG TERM GOAL #2   Title stand with  pain decreased >/= 75%   Time 12   Period Weeks   Status Achieved   PT LONG TERM GOAL #4   Title return to exercise with correct body mechanics due to pain decreased >/= 75%   Time 12   Period Weeks   Status On-going  trouble with running               Plan - 11/13/14 1055    Clinical Impression Statement Patient is a 39 year old female with pelvic floor pain with sitting.  Patient is having difficulty running due to back pain that will radiate into the pelvic floor muscles causing them into spasms.  Patient pelvis is staying in correct alignment. Patient reports exercise and soft tissue work is reducing her pain.  Patient reports the pain will has less intensity but still is limited in sitting.     Pt will benefit from skilled therapeutic intervention in order to improve on the following deficits Decreased range of motion;Difficulty walking;Impaired flexibility;Postural dysfunction;Decreased endurance;Decreased activity tolerance;Increased fascial restricitons;Pain;Increased muscle spasms;Decreased mobility;Decreased strength   Rehab Potential Good   PT Frequency 2x / week   PT Duration 12 weeks   PT Treatment/Interventions Moist Heat;Therapeutic activities;Patient/family education;Passive range of motion;Therapeutic exercise;Biofeedback;Manual techniques;Neuromuscular re-education;Cryotherapy;Electrical Stimulation;Functional mobility training   PT Next Visit Plan soft tissue work, internal soft tissue work, test pelvic floor strength   PT Home Exercise Plan current HEP   Consulted and Agree with Plan of Care Patient        Problem List Patient Active Problem List   Diagnosis Date Noted  . Abdominal pain, unspecified site 03/28/2014  . GERD (gastroesophageal reflux disease) 04/18/2013  . Routine general medical examination at a health care facility 05/21/2012  . SEIZURE DISORDER 04/18/2010    Coden Franchi,PT 11/13/2014, 10:59 AM  [facility] [address], [place], [place], [address] Phone: [phone]   Fax:  [phone]

## 2014-11-16 ENCOUNTER — Encounter: Payer: Federal, State, Local not specified - PPO | Admitting: Physical Therapy

## 2014-11-20 ENCOUNTER — Ambulatory Visit: Payer: Federal, State, Local not specified - PPO | Admitting: Physical Therapy

## 2014-11-20 ENCOUNTER — Encounter: Payer: Self-pay | Admitting: Physical Therapy

## 2014-11-20 DIAGNOSIS — M25559 Pain in unspecified hip: Secondary | ICD-10-CM

## 2014-11-20 DIAGNOSIS — R198 Other specified symptoms and signs involving the digestive system and abdomen: Secondary | ICD-10-CM

## 2014-11-20 DIAGNOSIS — M6289 Other specified disorders of muscle: Secondary | ICD-10-CM

## 2014-11-20 DIAGNOSIS — N8184 Pelvic muscle wasting: Secondary | ICD-10-CM | POA: Diagnosis not present

## 2014-11-20 NOTE — Therapy (Signed)
[facility] [address], [place], [place], [address] Phone: [phone]   Fax:  [phone]  Physical Therapy Treatment  Patient Details  Name: Michele Mills MRN: [id number] Date of Birth: July 02, 1975 Referring Provider:  Yvette Rack, MD  Encounter Date: 11/20/2014      PT End of Session - 11/20/14 1055    Visit Number 12   Date for PT Re-Evaluation 01/22/15   PT Start Time 3329   PT Stop Time 1100   PT Time Calculation (min) 45 min   Activity Tolerance Patient tolerated treatment well   Behavior During Therapy [facility] for tasks assessed/performed      Past Medical History  Diagnosis Date  . Seizure disorder     History reviewed. No pertinent past surgical history.  There were no vitals filed for this visit.  Visit Diagnosis:  Spastic pelvic floor syndrome - Plan: PT plan of care cert/re-cert  PFD (pelvic floor dysfunction) - Plan: PT plan of care cert/re-cert  Pain in joint, pelvic region and thigh, unspecified laterality - Plan: PT plan of care cert/re-cert      Subjective Assessment - 11/20/14 1018    Subjective I am on my cycle today.  My muscle feel loser.    Pertinent History Patient has scoliosis and has been seeing a chiropractor periodically   Limitations Sitting;Standing   How long can you sit comfortably? 2 hours with a break every 1/2 hour   How long can you stand comfortably? 1 hour   How long can you walk comfortably? No diffilculty   Patient Stated Goals reduce pain in rectal and vaginal.  Get back to the gym   Currently in Pain? Yes   Pain Score 2    Pain Location Pelvis   Pain Orientation Posterior   Pain Descriptors / Indicators Dull   Pain Type Chronic pain   Pain Onset More than a month ago   Pain Frequency Constant   Aggravating Factors  Constant with sitting   Pain Relieving Factors standing and moving   Effect of Pain on Daily Activities sittting   Multiple Pain Sites No            [facility] PT Assessment - 11/20/14 0001    Assessment   Medical Diagnosis spastic pelvic floor syndrome   Onset Date/Surgical Date 06/23/14   Next MD Visit 09/07/2014   Prior Function   Level of Independence Independent with basic ADLs   Vocation Full time employment   Vocation Requirements sit and standing   Leisure exercise at gym but difficult   Posture/Postural Control   Posture Comments Pain with lunge position and sitting position in the pelvic floor   Flexibility   Piriformis tight   Levator Ani tight   Obturator Internus tight   Palpation   Palpation comment decreased pelvic floor contraction on the right as patient sits on therapist hands to feel the contraction                  Pelvic Floor Special Questions - 11/20/14 0001    Strength weak squeeze, no lift           [facility] Adult PT Treatment/Exercise - 11/20/14 0001    Lumbar Exercises: Aerobic   Elliptical level 1 x 5 min  no pain   Lumbar Exercises: Machines for Strengthening   Other Lumbar Machine Exercise lat bar 35 # 15 times with vc to depress shoulder; Chest press no weight 10x; Bicep curl with  pelvic neutral 10x 10 # each arm; bil. elbow extension with pulleys 25# wiht vc to retract scapula 15 times; Scapular retraction 15# with pelvic neutral and lunge position    contract pelvic floor   Lumbar Exercises: Standing   Other Standing Lumbar Exercises squat holding red plyoball 10 times with pelvic floor contraction    Other Standing Lumbar Exercises lunge position with pain in pelvic floor   Lumbar Exercises: Supine   Other Supine Lumbar Exercises abdominal curl-up with ball squeeze 20x with pelvic floor contraction; abdominal contraction with ball squeeze  wiht bil. hip flex/ext working on controlled range; bicycle with pelvic floor contraction  4x5  pelvic floor contraction                  PT Short Term Goals - 09/25/14 0856    PT SHORT TERM GOAL #1   Title return  demonstration of flexibility exercises   Time 4   Period Weeks   Status Achieved   PT SHORT TERM GOAL #2   Title sit with pain decreased >/= 25%   Time 4   Period Weeks   Status Achieved   PT SHORT TERM GOAL #3   Title stand with pain decreased >/= 25%   Time 4   Period Weeks   Status Achieved   PT SHORT TERM GOAL #4   Title understand correct sitting posture to decreased pelvic floor spasms   Time 4   Period Weeks   Status Achieved           PT Long Term Goals - 11/20/14 1054    PT LONG TERM GOAL #1   Title sit with pain decreased >/= 75%   Time 12   Period Weeks   Status On-going  improved by 50%   PT LONG TERM GOAL #2   Title stand with pain decreased >/= 75%   Time 12   Period Weeks   Status Achieved   PT LONG TERM GOAL #3   Title Marinoff scale 1/3 so she is able to have intercourse   Time 12   Period Weeks   Status On-going  fluctuates from 1/3to 2/3   PT LONG TERM GOAL #4   Title return to exercise with correct body mechanics due to pain decreased >/= 75%   Time 12   Period Weeks   Status On-going  learning correct ways to exercise               Plan - 11/20/14 1416    Clinical Impression Statement Patient is a 39 year old female with pelvic pain with sitting and lunge position.  Patient has difficulty with contracting the right pelvic floor more than the left.  Patient continues to have decreased strength of the pelvic floor.  Patient reports pain with intercourse fluctuates.  Patient is unable to jog due to pelvic pain.  Patient has difficulty with fully contracting her lower  abdominals with exercise without verbal cues.  Patient has not been able to sit without pain but can manage it with relaxation exercises.  Patient has palpable tenderness located in pelvic floor muscles.  Patient would benefit from physical therapy  to reduce pain and muscle spasms with sitting.    Pt will benefit from skilled therapeutic intervention in order to improve on  the following deficits Decreased range of motion;Difficulty walking;Impaired flexibility;Postural dysfunction;Decreased endurance;Decreased activity tolerance;Increased fascial restricitons;Pain;Increased muscle spasms;Decreased mobility;Decreased strength   Rehab Potential Good   PT Frequency 2x / week  PT Duration 12 weeks   PT Treatment/Interventions Moist Heat;Therapeutic activities;Patient/family education;Passive range of motion;Therapeutic exercise;Biofeedback;Manual techniques;Neuromuscular re-education;Cryotherapy;Electrical Stimulation;Functional mobility training   PT Next Visit Plan soft tissue work, internal soft tissue work, test pelvic floor strength   PT Home Exercise Plan current HEP   Consulted and Agree with Plan of Care Patient        Problem List Patient Active Problem List   Diagnosis Date Noted  . Abdominal pain, unspecified site 03/28/2014  . GERD (gastroesophageal reflux disease) 04/18/2013  . Routine general medical examination at a health care facility 05/21/2012  . SEIZURE DISORDER 04/18/2010    GRAY,CHERYL,PT 11/20/2014, 2:24 PM  [facility] [address] [place], [place], [address] Phone: [phone]   Fax:  [phone]

## 2014-11-23 ENCOUNTER — Encounter: Payer: Federal, State, Local not specified - PPO | Admitting: Physical Therapy

## 2014-11-29 ENCOUNTER — Encounter: Payer: Self-pay | Admitting: Physical Therapy

## 2014-11-29 ENCOUNTER — Ambulatory Visit: Payer: Federal, State, Local not specified - PPO | Attending: Obstetrics and Gynecology | Admitting: Physical Therapy

## 2014-11-29 DIAGNOSIS — R198 Other specified symptoms and signs involving the digestive system and abdomen: Secondary | ICD-10-CM | POA: Insufficient documentation

## 2014-11-29 DIAGNOSIS — M25559 Pain in unspecified hip: Secondary | ICD-10-CM | POA: Diagnosis present

## 2014-11-29 DIAGNOSIS — M6289 Other specified disorders of muscle: Secondary | ICD-10-CM

## 2014-11-29 DIAGNOSIS — N8184 Pelvic muscle wasting: Secondary | ICD-10-CM | POA: Insufficient documentation

## 2014-11-29 NOTE — Therapy (Signed)
[facility] [address] [place], [place], [address] Phone: [phone]   Fax:  [phone]  Physical Therapy Treatment  Patient Details  Name: Michele Mills MRN: [id number] Date of Birth: [birth year] Referring Provider:  Yvette Rack, MD  Encounter Date: 11/29/2014      PT End of Session - 11/29/14 1024    Visit Number 13   Date for PT Re-Evaluation 01/22/15   PT Start Time 1287   PT Stop Time 1100   PT Time Calculation (min) 45 min   Activity Tolerance Patient tolerated treatment well   Behavior During Therapy [facility] for tasks assessed/performed      Past Medical History  Diagnosis Date  . Seizure disorder     History reviewed. No pertinent past surgical history.  There were no vitals filed for this visit.  Visit Diagnosis:  PFD (pelvic floor dysfunction)  Spastic pelvic floor syndrome      Subjective Assessment - 11/29/14 1019    Subjective I am doing well except for sitting. I was able [place] drive to the beach with with discomfort and was able to make it.  iIhad to take Advil with several breaks. Sitting on a soft chair is getting more comfortable.    Pertinent History Patient has scoliosis and has been seeing a chiropractor periodically   Limitations Sitting   How long can you sit comfortably? pressure everytime I sit.  I can sit for one hour   How long can you stand comfortably? No difficulty    How long can you walk comfortably? No difficulty   Patient Stated Goals decreased pain with sitting   Currently in Pain? Yes   Pain Score 3    Pain Location Pelvis  along SI-inner groin.    Pain Orientation Left;Right  left is worse than right.   Pain Descriptors / Indicators Throbbing;Pressure  left side has tugging   Pain Type Chronic pain   Pain Onset More than a month ago   Pain Frequency Intermittent   Aggravating Factors  sitting   Pain Relieving Factors move and walk,  lay down   Effect of Pain  on Daily Activities sitting   Multiple Pain Sites No                         [facility] Adult PT Treatment/Exercise - 11/29/14 0001    Manual Therapy   Manual Therapy Soft tissue mobilization;Myofascial release   Soft tissue mobilization to bil. obturator internist , bil. levator ani, bil. quadratus in sidely with hip movements   Myofascial Release external perineal area                PT Education - 11/29/14 1056    Education provided Yes   Education Details instructed patient on how to perform soft tissue work to the external perineal area in sitting, instruction on seat cushion and where to purchase.    Person(s) Educated Patient   Methods Explanation;Demonstration;Tactile cues;Verbal cues   Comprehension Returned demonstration;Verbalized understanding          PT Short Term Goals - 09/25/14 0856    PT SHORT TERM GOAL #1   Title return demonstration of flexibility exercises   Time 4   Period Weeks   Status Achieved   PT SHORT TERM GOAL #2   Title sit with pain decreased >/= 25%   Time 4   Period Weeks   Status Achieved   PT SHORT TERM  GOAL #3   Title stand with pain decreased >/= 25%   Time 4   Period Weeks   Status Achieved   PT SHORT TERM GOAL #4   Title understand correct sitting posture to decreased pelvic floor spasms   Time 4   Period Weeks   Status Achieved           PT Long Term Goals - 11/29/14 1101    PT LONG TERM GOAL #1   Title sit with pain decreased >/= 75%   Time 12   Period Weeks   Status On-going  80% better but only sit for 1 hour and has constant pain   PT LONG TERM GOAL #3   Title Marinoff scale 1/3 so she is able to have intercourse   Time 12   Period Weeks   Status Achieved   PT LONG TERM GOAL #4   Time 12   Period Weeks   Status On-going  has to use light weights               Plan - 11/29/14 1057    Clinical Impression Statement Patient is a 39 year old female with pelvic pain. Paitent reports  her pain is 80% better.  Patient is able to jog but has to keep a slow speed or pain will increase.  Patient is back to the gym but has to keep with low weights or pelvic pain will increase. Patient does not have pain wtih intercourse but dicomfort with foreplay due to muscle tenderness. Patient has not met goals at this time but is progressing. Patient reports her pain decreased to 2/10 after therapy.    Pt will benefit from skilled therapeutic intervention in order to improve on the following deficits Decreased range of motion;Difficulty walking;Impaired flexibility;Postural dysfunction;Decreased endurance;Decreased activity tolerance;Increased fascial restricitons;Pain;Increased muscle spasms;Decreased mobility;Decreased strength   PT Frequency 2x / week   PT Duration 12 weeks   PT Treatment/Interventions Moist Heat;Therapeutic activities;Patient/family education;Passive range of motion;Therapeutic exercise;Biofeedback;Manual techniques;Neuromuscular re-education;Cryotherapy;Electrical Stimulation;Functional mobility training   PT Next Visit Plan test pelvic floor strength, review exercises to pelvic floor   PT Home Exercise Plan current HEP        Problem List Patient Active Problem List   Diagnosis Date Noted  . Abdominal pain, unspecified site 03/28/2014  . GERD (gastroesophageal reflux disease) 04/18/2013  . Routine general medical examination at a health care facility 05/21/2012  . SEIZURE DISORDER 04/18/2010    Draylen Lobue,PT 11/29/2014, 11:03 AM  [facility] [address] [place], [place], [address] Phone: [phone]   Fax:  [phone]

## 2014-12-04 ENCOUNTER — Encounter: Payer: Self-pay | Admitting: Physical Therapy

## 2014-12-04 ENCOUNTER — Ambulatory Visit: Payer: Federal, State, Local not specified - PPO | Admitting: Physical Therapy

## 2014-12-04 DIAGNOSIS — M6289 Other specified disorders of muscle: Secondary | ICD-10-CM

## 2014-12-04 DIAGNOSIS — R198 Other specified symptoms and signs involving the digestive system and abdomen: Secondary | ICD-10-CM

## 2014-12-04 DIAGNOSIS — N8184 Pelvic muscle wasting: Secondary | ICD-10-CM | POA: Diagnosis not present

## 2014-12-04 DIAGNOSIS — M25559 Pain in unspecified hip: Secondary | ICD-10-CM

## 2014-12-04 NOTE — Therapy (Signed)
[facility] [address] [place], [place], [address] Phone: [phone]   Fax:  [phone]  Physical Therapy Treatment  Patient Details  Name: Michele Mills MRN: [id number] Date of Birth: [birth year] Referring Provider:  Yvette Rack, MD  Encounter Date: 12/04/2014      PT End of Session - 12/04/14 1241    Visit Number 14   Date for PT Re-Evaluation 01/22/15   PT Start Time 1230   PT Stop Time 1310   PT Time Calculation (min) 40 min   Activity Tolerance Patient tolerated treatment well   Behavior During Therapy [facility] for tasks assessed/performed      Past Medical History  Diagnosis Date  . Seizure disorder     History reviewed. No pertinent past surgical history.  There were no vitals filed for this visit.  Visit Diagnosis:  PFD (pelvic floor dysfunction)  Pain in joint, pelvic region and thigh, unspecified laterality  Spastic pelvic floor syndrome      Subjective Assessment - 12/04/14 1235    Subjective Sitting is not better. I got the cushion and does not help. I had an ultrasound to see about the pain.    Pertinent History Patient has scoliosis and has been seeing a chiropractor periodically   Limitations Sitting   How long can you sit comfortably? pressure everytime I sit.  I can sit for one hour   How long can you stand comfortably? No difficulty    How long can you walk comfortably? No difficulty   Patient Stated Goals decreased pain with sitting   Currently in Pain? Yes   Pain Score 3    Pain Location Pelvis   Pain Orientation Right;Left   Pain Descriptors / Indicators Throbbing   Pain Type Chronic pain   Pain Radiating Towards radiate to low back   Pain Onset More than a month ago   Pain Frequency Intermittent   Aggravating Factors  sitting    Pain Relieving Factors move and walk, lay down   Effect of Pain on Daily Activities sitting    Multiple Pain Sites No                          [facility] Adult PT Treatment/Exercise - 12/04/14 0001    Ultrasound   Ultrasound Location left levator externallly   Ultrasound Parameters 100%, 61mz, 1.2w/cm2, x 8 min.    Manual Therapy   Manual Therapy Soft tissue mobilization   Soft tissue mobilization bil. levator ani externally and inner thigh                PT Education - 12/04/14 1314    Education provided No          PT Short Term Goals - 09/25/14 0856    PT SHORT TERM GOAL #1   Title return demonstration of flexibility exercises   Time 4   Period Weeks   Status Achieved   PT SHORT TERM GOAL #2   Title sit with pain decreased >/= 25%   Time 4   Period Weeks   Status Achieved   PT SHORT TERM GOAL #3   Title stand with pain decreased >/= 25%   Time 4   Period Weeks   Status Achieved   PT SHORT TERM GOAL #4   Title understand correct sitting posture to decreased pelvic floor spasms   Time 4   Period Weeks   Status Achieved  PT Long Term Goals - 12/04/14 1320    PT LONG TERM GOAL #1   Title sit with pain decreased >/= 75%   Time 12   Period Weeks   Status On-going   PT LONG TERM GOAL #2   Title stand with pain decreased >/= 75%   Time 12   Period Weeks   Status Achieved   PT LONG TERM GOAL #4   Title return to exercise with correct body mechanics due to pain decreased >/= 75%   Time 12   Period Weeks   Status On-going               Plan - 12/04/14 1314    Clinical Impression Statement Patient main problem is pain with sitting.  The theraseat does not help with her pain with sitting and has return the seat.  Patient reports she is trying to get the results of her ultrasound and having difficulty.  After treatment her pain decreased to 2/10.  Patient has palpable tenderness located in bil. transervers perineaum. Patient has not met any more goals due to her main complaint is pain with sitting. Pain with intercourse has decreased by 80%.     Pt will benefit from skilled therapeutic intervention in order to improve on the following deficits Decreased range of motion;Difficulty walking;Impaired flexibility;Postural dysfunction;Decreased endurance;Decreased activity tolerance;Increased fascial restricitons;Pain;Increased muscle spasms;Decreased mobility;Decreased strength   Rehab Potential Good   PT Frequency 2x / week   PT Duration 12 weeks   PT Treatment/Interventions Moist Heat;Therapeutic activities;Patient/family education;Passive range of motion;Therapeutic exercise;Biofeedback;Manual techniques;Neuromuscular re-education;Cryotherapy;Electrical Stimulation;Functional mobility training   PT Next Visit Plan test pelvic floor strength; see if ultrasound helped   PT Home Exercise Plan current HEP   Consulted and Agree with Plan of Care Patient        Problem List Patient Active Problem List   Diagnosis Date Noted  . Abdominal pain, unspecified site 03/28/2014  . GERD (gastroesophageal reflux disease) 04/18/2013  . Routine general medical examination at a health care facility 05/21/2012  . SEIZURE DISORDER 04/18/2010    Verdun Rackley,CHERYLPT 12/04/2014, 1:21 PM  [facility] [address], [place], [place], [address] Phone: [phone]   Fax:  [phone]

## 2014-12-07 DIAGNOSIS — N900 Mild vulvar dysplasia: Secondary | ICD-10-CM | POA: Insufficient documentation

## 2014-12-07 DIAGNOSIS — R102 Pelvic and perineal pain: Secondary | ICD-10-CM | POA: Insufficient documentation

## 2014-12-11 ENCOUNTER — Encounter: Payer: Federal, State, Local not specified - PPO | Admitting: Physical Therapy

## 2014-12-11 NOTE — Therapy (Signed)
[facility] [address], [place], [place], [address] Phone: [phone]   Fax:  [phone]  Patient Details  Name: Michele Mills MRN: [id number] Date of Birth: Sep 17, 1975 Referring Provider:  Yvette Rack, MD  Encounter Date: 12/04/2014 PHYSICAL THERAPY DISCHARGE SUMMARY  Visits from Start of Care: 14 Current functional level related to goals / functional outcomes: Patient has not met goal for reduction of pain in sitting and not returning to exercise.  Patient pain with intercourse has decreased by 80%.  Patient called on 12/11/2014 to cancel all of her appointments with no reason and was okay to be discharged.    Remaining deficits: Patient still has difficulty with sitting.  Patient got a special cushion but did not help.  Patient has been seeing a chiropractor periodically to help her back pain.  Patient has palpable tenderness located in bi. transverse perineum.     Education / Equipment: HEP  Plan: Patient agrees to discharge.  Patient goals were partially met. Patient is being discharged due to the patient's request.  Thank you for the referral. Earlie Counts, PT 12/11/2014 12:27 PM ?????      GRAY,CHERYL 12/11/2014, 12:30 PM  [facility] [address], [place], [place], [address] Phone: [phone]   Fax:  [phone]

## 2014-12-18 ENCOUNTER — Encounter: Payer: Federal, State, Local not specified - PPO | Admitting: Physical Therapy

## 2014-12-25 ENCOUNTER — Encounter: Payer: Federal, State, Local not specified - PPO | Admitting: Physical Therapy

## 2015-01-16 ENCOUNTER — Telehealth: Payer: Self-pay | Admitting: Internal Medicine

## 2015-01-16 DIAGNOSIS — Z0000 Encounter for general adult medical examination without abnormal findings: Secondary | ICD-10-CM

## 2015-01-16 NOTE — Telephone Encounter (Signed)
°  Patient stated that she don't remember if she had the chicken pox, is there a way you can find out, if not she would like to be tested. Please advise

## 2015-01-16 NOTE — Telephone Encounter (Signed)
Test ordered

## 2015-01-17 ENCOUNTER — Other Ambulatory Visit: Payer: Federal, State, Local not specified - PPO

## 2015-01-17 DIAGNOSIS — Z0000 Encounter for general adult medical examination without abnormal findings: Secondary | ICD-10-CM

## 2015-01-17 NOTE — Telephone Encounter (Signed)
Patient notified and will stop by the lab.

## 2015-01-18 ENCOUNTER — Encounter: Payer: Self-pay | Admitting: Internal Medicine

## 2015-01-18 LAB — VARICELLA ZOSTER ANTIBODY, IGG: VARICELLA IGG: 3903 {index} — AB (ref <135.00)

## 2015-07-17 LAB — HM PAP SMEAR

## 2015-10-02 DIAGNOSIS — L089 Local infection of the skin and subcutaneous tissue, unspecified: Secondary | ICD-10-CM | POA: Diagnosis not present

## 2015-10-02 DIAGNOSIS — D485 Neoplasm of uncertain behavior of skin: Secondary | ICD-10-CM | POA: Diagnosis not present

## 2015-10-02 DIAGNOSIS — D225 Melanocytic nevi of trunk: Secondary | ICD-10-CM | POA: Diagnosis not present

## 2015-10-23 DIAGNOSIS — N809 Endometriosis, unspecified: Secondary | ICD-10-CM | POA: Insufficient documentation

## 2015-10-23 DIAGNOSIS — N94819 Vulvodynia, unspecified: Secondary | ICD-10-CM | POA: Insufficient documentation

## 2015-10-23 DIAGNOSIS — N898 Other specified noninflammatory disorders of vagina: Secondary | ICD-10-CM | POA: Diagnosis not present

## 2015-10-23 DIAGNOSIS — N900 Mild vulvar dysplasia: Secondary | ICD-10-CM | POA: Diagnosis not present

## 2015-10-23 DIAGNOSIS — R102 Pelvic and perineal pain: Secondary | ICD-10-CM | POA: Diagnosis not present

## 2015-11-06 DIAGNOSIS — L900 Lichen sclerosus et atrophicus: Secondary | ICD-10-CM | POA: Diagnosis not present

## 2015-11-20 ENCOUNTER — Ambulatory Visit: Payer: Federal, State, Local not specified - PPO | Admitting: Internal Medicine

## 2015-12-25 DIAGNOSIS — L304 Erythema intertrigo: Secondary | ICD-10-CM | POA: Diagnosis not present

## 2016-01-24 DIAGNOSIS — N903 Dysplasia of vulva, unspecified: Secondary | ICD-10-CM | POA: Diagnosis not present

## 2016-01-24 DIAGNOSIS — N809 Endometriosis, unspecified: Secondary | ICD-10-CM | POA: Diagnosis not present

## 2016-01-24 DIAGNOSIS — N94819 Vulvodynia, unspecified: Secondary | ICD-10-CM | POA: Diagnosis not present

## 2016-02-21 DIAGNOSIS — L988 Other specified disorders of the skin and subcutaneous tissue: Secondary | ICD-10-CM | POA: Diagnosis not present

## 2016-02-21 DIAGNOSIS — Z8742 Personal history of other diseases of the female genital tract: Secondary | ICD-10-CM | POA: Diagnosis not present

## 2016-02-21 DIAGNOSIS — L299 Pruritus, unspecified: Secondary | ICD-10-CM | POA: Diagnosis not present

## 2016-02-21 DIAGNOSIS — N809 Endometriosis, unspecified: Secondary | ICD-10-CM | POA: Diagnosis not present

## 2016-02-21 DIAGNOSIS — R569 Unspecified convulsions: Secondary | ICD-10-CM | POA: Diagnosis not present

## 2016-02-21 DIAGNOSIS — R102 Pelvic and perineal pain: Secondary | ICD-10-CM | POA: Diagnosis not present

## 2016-03-18 DIAGNOSIS — K080 Exfoliation of teeth due to systemic causes: Secondary | ICD-10-CM | POA: Diagnosis not present

## 2016-04-02 DIAGNOSIS — D485 Neoplasm of uncertain behavior of skin: Secondary | ICD-10-CM | POA: Diagnosis not present

## 2016-04-02 DIAGNOSIS — D225 Melanocytic nevi of trunk: Secondary | ICD-10-CM | POA: Diagnosis not present

## 2016-04-02 DIAGNOSIS — D2262 Melanocytic nevi of left upper limb, including shoulder: Secondary | ICD-10-CM | POA: Diagnosis not present

## 2016-04-02 DIAGNOSIS — L989 Disorder of the skin and subcutaneous tissue, unspecified: Secondary | ICD-10-CM | POA: Diagnosis not present

## 2016-04-02 DIAGNOSIS — Z1283 Encounter for screening for malignant neoplasm of skin: Secondary | ICD-10-CM | POA: Diagnosis not present

## 2016-04-11 DIAGNOSIS — L905 Scar conditions and fibrosis of skin: Secondary | ICD-10-CM | POA: Diagnosis not present

## 2016-04-11 DIAGNOSIS — D485 Neoplasm of uncertain behavior of skin: Secondary | ICD-10-CM | POA: Diagnosis not present

## 2016-04-11 DIAGNOSIS — D225 Melanocytic nevi of trunk: Secondary | ICD-10-CM | POA: Diagnosis not present

## 2016-04-14 DIAGNOSIS — R198 Other specified symptoms and signs involving the digestive system and abdomen: Secondary | ICD-10-CM | POA: Diagnosis not present

## 2016-04-14 DIAGNOSIS — N94819 Vulvodynia, unspecified: Secondary | ICD-10-CM | POA: Diagnosis not present

## 2016-04-14 DIAGNOSIS — N809 Endometriosis, unspecified: Secondary | ICD-10-CM | POA: Diagnosis not present

## 2016-04-14 DIAGNOSIS — Z6822 Body mass index (BMI) 22.0-22.9, adult: Secondary | ICD-10-CM | POA: Diagnosis not present

## 2016-04-30 ENCOUNTER — Other Ambulatory Visit: Payer: Self-pay | Admitting: Obstetrics and Gynecology

## 2016-04-30 DIAGNOSIS — Z8742 Personal history of other diseases of the female genital tract: Secondary | ICD-10-CM

## 2016-05-06 ENCOUNTER — Ambulatory Visit
Admission: RE | Admit: 2016-05-06 | Discharge: 2016-05-06 | Disposition: A | Discharge status: Home / Self Care | Payer: Federal, State, Local not specified - PPO | Source: Ambulatory Visit | Attending: Obstetrics and Gynecology | Admitting: Obstetrics and Gynecology

## 2016-05-06 DIAGNOSIS — D252 Subserosal leiomyoma of uterus: Secondary | ICD-10-CM | POA: Diagnosis not present

## 2016-05-06 DIAGNOSIS — Z8742 Personal history of other diseases of the female genital tract: Secondary | ICD-10-CM

## 2016-05-19 DIAGNOSIS — M6281 Muscle weakness (generalized): Secondary | ICD-10-CM | POA: Diagnosis not present

## 2016-05-19 DIAGNOSIS — N94819 Vulvodynia, unspecified: Secondary | ICD-10-CM | POA: Diagnosis not present

## 2016-05-19 DIAGNOSIS — M62838 Other muscle spasm: Secondary | ICD-10-CM | POA: Diagnosis not present

## 2016-05-19 DIAGNOSIS — R278 Other lack of coordination: Secondary | ICD-10-CM | POA: Diagnosis not present

## 2016-05-26 DIAGNOSIS — M62838 Other muscle spasm: Secondary | ICD-10-CM | POA: Diagnosis not present

## 2016-05-26 DIAGNOSIS — R350 Frequency of micturition: Secondary | ICD-10-CM | POA: Diagnosis not present

## 2016-05-26 DIAGNOSIS — R278 Other lack of coordination: Secondary | ICD-10-CM | POA: Diagnosis not present

## 2016-05-26 DIAGNOSIS — M6281 Muscle weakness (generalized): Secondary | ICD-10-CM | POA: Diagnosis not present

## 2016-06-02 DIAGNOSIS — M62838 Other muscle spasm: Secondary | ICD-10-CM | POA: Diagnosis not present

## 2016-06-02 DIAGNOSIS — R278 Other lack of coordination: Secondary | ICD-10-CM | POA: Diagnosis not present

## 2016-06-02 DIAGNOSIS — K5900 Constipation, unspecified: Secondary | ICD-10-CM | POA: Diagnosis not present

## 2016-06-02 DIAGNOSIS — M6281 Muscle weakness (generalized): Secondary | ICD-10-CM | POA: Diagnosis not present

## 2016-06-09 ENCOUNTER — Ambulatory Visit (INDEPENDENT_AMBULATORY_CARE_PROVIDER_SITE_OTHER): Payer: Federal, State, Local not specified - PPO | Admitting: Internal Medicine

## 2016-06-09 ENCOUNTER — Other Ambulatory Visit (INDEPENDENT_AMBULATORY_CARE_PROVIDER_SITE_OTHER): Payer: Federal, State, Local not specified - PPO

## 2016-06-09 ENCOUNTER — Encounter: Payer: Self-pay | Admitting: Internal Medicine

## 2016-06-09 ENCOUNTER — Ambulatory Visit (INDEPENDENT_AMBULATORY_CARE_PROVIDER_SITE_OTHER)
Admission: RE | Admit: 2016-06-09 | Discharge: 2016-06-09 | Disposition: A | Discharge status: Home / Self Care | Payer: Federal, State, Local not specified - PPO | Source: Ambulatory Visit | Attending: Internal Medicine | Admitting: Internal Medicine

## 2016-06-09 VITALS — BP 140/90 | HR 132 | Temp 98.5°F | Resp 16 | Ht 64.0 in | Wt 129.0 lb

## 2016-06-09 DIAGNOSIS — J181 Lobar pneumonia, unspecified organism: Secondary | ICD-10-CM

## 2016-06-09 DIAGNOSIS — R000 Tachycardia, unspecified: Secondary | ICD-10-CM

## 2016-06-09 DIAGNOSIS — J069 Acute upper respiratory infection, unspecified: Secondary | ICD-10-CM | POA: Diagnosis not present

## 2016-06-09 DIAGNOSIS — J189 Pneumonia, unspecified organism: Secondary | ICD-10-CM

## 2016-06-09 DIAGNOSIS — R05 Cough: Secondary | ICD-10-CM

## 2016-06-09 DIAGNOSIS — R059 Cough, unspecified: Secondary | ICD-10-CM

## 2016-06-09 DIAGNOSIS — B9789 Other viral agents as the cause of diseases classified elsewhere: Secondary | ICD-10-CM

## 2016-06-09 LAB — CBC WITH DIFFERENTIAL/PLATELET
BASOS PCT: 0.3 % (ref 0.0–3.0)
Basophils Absolute: 0 10*3/uL (ref 0.0–0.1)
Eosinophils Absolute: 0.1 10*3/uL (ref 0.0–0.7)
Eosinophils Relative: 1.2 % (ref 0.0–5.0)
HEMATOCRIT: 40.2 % (ref 36.0–46.0)
HEMOGLOBIN: 13.7 g/dL (ref 12.0–15.0)
LYMPHS PCT: 17.7 % (ref 12.0–46.0)
Lymphs Abs: 1.1 10*3/uL (ref 0.7–4.0)
MCHC: 34.2 g/dL (ref 30.0–36.0)
MCV: 89.8 fl (ref 78.0–100.0)
MONOS PCT: 9.1 % (ref 3.0–12.0)
Monocytes Absolute: 0.6 10*3/uL (ref 0.1–1.0)
Neutro Abs: 4.4 10*3/uL (ref 1.4–7.7)
Neutrophils Relative %: 71.7 % (ref 43.0–77.0)
Platelets: 228 10*3/uL (ref 150.0–400.0)
RBC: 4.48 Mil/uL (ref 3.87–5.11)
RDW: 12.5 % (ref 11.5–15.5)
WBC: 6.2 10*3/uL (ref 4.0–10.5)

## 2016-06-09 LAB — COMPREHENSIVE METABOLIC PANEL
ALBUMIN: 4.3 g/dL (ref 3.5–5.2)
ALT: 13 U/L (ref 0–35)
AST: 14 U/L (ref 0–37)
Alkaline Phosphatase: 40 U/L (ref 39–117)
BILIRUBIN TOTAL: 0.3 mg/dL (ref 0.2–1.2)
BUN: 5 mg/dL — ABNORMAL LOW (ref 6–23)
CALCIUM: 9.5 mg/dL (ref 8.4–10.5)
CHLORIDE: 99 meq/L (ref 96–112)
CO2: 30 mEq/L (ref 19–32)
Creatinine, Ser: 0.64 mg/dL (ref 0.40–1.20)
GFR: 109.6 mL/min (ref >60.00)
Glucose, Bld: 104 mg/dL — ABNORMAL HIGH (ref 70–99)
Potassium: 3.8 mEq/L (ref 3.5–5.1)
Sodium: 138 mEq/L (ref 135–145)
Total Protein: 7.7 g/dL (ref 6.0–8.3)

## 2016-06-09 LAB — HCG, QUANTITATIVE, PREGNANCY: Quantitative HCG: 0.47 m[IU]/mL

## 2016-06-09 MED ORDER — HYDROCODONE-HOMATROPINE 5-1.5 MG/5ML PO SYRP: 5.0000 mL | ORAL_SOLUTION | Freq: Three times a day (TID) | ORAL | 0 refills | Status: DC | PRN

## 2016-06-09 NOTE — Progress Notes (Signed)
Pre visit review using our clinic review tool, if applicable. No additional management support is needed unless otherwise documented below in the visit note. 

## 2016-06-09 NOTE — Progress Notes (Signed)
Subjective:  Patient ID: Michele Mills, female    DOB: [birth year]  Age: 40 y.o. MRN: [id number]  CC: Cough   HPI [name] presents for a 3 day history of fever to 101 with nonproductive cough, sore throat, muscle aches, elevated heart rate, and night sweats. She denies  hemoptysis, chest pain, dizziness, lightheadedness, near syncope, edema, extremity pain or swelling, or headache.  Outpatient Medications Prior to Visit  Medication Sig Dispense Refill  . fluconazole (DIFLUCAN) 100 MG tablet Take 100 mg by mouth daily.     No facility-administered medications prior to visit.     ROS Review of Systems  Constitutional: Positive for chills and fever. Negative for diaphoresis, fatigue and unexpected weight change.  HENT: Positive for sore throat. Negative for facial swelling, sinus pressure and trouble swallowing.   Eyes: Negative for visual disturbance.  Respiratory: Positive for cough. Negative for choking, chest tightness, shortness of breath, wheezing and stridor.   Cardiovascular: Positive for palpitations. Negative for chest pain and leg swelling.  Gastrointestinal: Negative for abdominal pain, blood in stool, constipation, diarrhea, nausea and vomiting.  Endocrine: Negative.  Negative for cold intolerance and heat intolerance.  Genitourinary: Negative.  Negative for difficulty urinating.  Musculoskeletal: Negative.  Negative for back pain, myalgias and neck pain.  Skin: Negative.  Negative for color change and rash.  Allergic/Immunologic: Negative.   Neurological: Negative.  Negative for dizziness, syncope, weakness, light-headedness, numbness and headaches.  Hematological: Negative for adenopathy. Does not bruise/bleed easily.  Psychiatric/Behavioral: Negative.     Objective:  BP 140/90 (BP Location: Left Arm, Patient Position: Sitting, Cuff Size: Normal)   Pulse (!) 132   Temp 98.5 F (36.9 C) (Oral)   Resp 16   Ht 5\' 4"  (1.626 m)   Wt 129 lb (58.5 kg)   LMP  05/19/2016 (Approximate)   SpO2 98%   BMI 22.14 kg/m   BP Readings from Last 3 Encounters:  06/09/16 140/90  08/07/14 110/82  03/28/14 128/82    Wt Readings from Last 3 Encounters:  06/09/16 129 lb (58.5 kg)  08/07/14 128 lb (58.1 kg)  03/28/14 128 lb (58.1 kg)    Physical Exam  Constitutional: She is oriented to person, place, and time. No distress.  HENT:  Mouth/Throat: Oropharynx is clear and moist. No oropharyngeal exudate.  Eyes: Conjunctivae are normal. Right eye exhibits no discharge. Left eye exhibits no discharge. No scleral icterus.  Neck: Normal range of motion. Neck supple. No JVD present. No tracheal deviation present. No thyromegaly present.  Cardiovascular: Normal rate, regular rhythm, normal heart sounds and intact distal pulses.  Exam reveals no gallop and no friction rub.   No murmur heard. EKG -  Sinus  Tachycardia  WITHIN NORMAL LIMITS  Pulmonary/Chest: Effort normal and breath sounds normal. No stridor. No respiratory distress. She has no wheezes. She has no rales. She exhibits no tenderness.  Abdominal: Soft. Bowel sounds are normal. She exhibits no distension and no mass. There is no tenderness. There is no rebound and no guarding.  Musculoskeletal: Normal range of motion. She exhibits no edema, tenderness or deformity.  Lymphadenopathy:    She has no cervical adenopathy.  Neurological: She is oriented to person, place, and time.  Skin: Skin is warm and dry. No rash noted. She is not diaphoretic. No erythema. No pallor.  Psychiatric: She has a normal mood and affect. Her behavior is normal. Judgment and thought content normal.  Vitals reviewed.   Lab Results  Component Value Date  WBC 6.2 06/09/2016   HGB 13.7 06/09/2016   HCT 40.2 06/09/2016   PLT 228.0 06/09/2016   GLUCOSE 104 (H) 06/09/2016   ALT 13 06/09/2016   AST 14 06/09/2016   NA 138 06/09/2016   K 3.8 06/09/2016   CL 99 06/09/2016   CREATININE 0.64 06/09/2016   BUN 5 (L) 06/09/2016    CO2 30 06/09/2016   TSH 1.59 06/09/2016    US Pelvis Complete  Result Date: 05/06/2016 CLINICAL DATA:  History of endometriosis. EXAM: TRANSABDOMINAL AND TRANSVAGINAL ULTRASOUND OF PELVIS TECHNIQUE: Both transabdominal and transvaginal ultrasound examinations of the pelvis were performed. Transabdominal technique was performed for global imaging of the pelvis including uterus, ovaries, adnexal regions, and pelvic cul-de-sac. It was necessary to proceed with endovaginal exam following the transabdominal exam to visualize the right ovary. COMPARISON:  None FINDINGS: Uterus Measurements: 10.2 x 5.3 x 6.4 cm. Appears retroverted. Small posterior subserosal fibroid measures 2 x 1.7 x 1.7 cm. Endometrium Thickness: 8 mm.  No focal abnormality visualized. Right ovary Measurements: 4.3 x 2.4 x 2.5 cm. Physiologic appearing cyst is noted measuring 2.8 cm. Left ovary Measurements: 3.2 x 1.8 x 2.0 cm. Normal appearance/no adnexal mass. Other findings No abnormal free fluid. IMPRESSION: 1. No acute findings. 2. Posterior uterine fibroid appears subserosal. Electronically Signed   By: Kerby Moors M.D.   On: 05/06/2016 15:41    Assessment & Plan:   [name] was seen today for cough.  Diagnoses and all orders for this visit:  Tachycardia- She has a mildly elevated heart rate and otherwise normal EKG, her d-dimer is not significantly elevated, her other labs are negative for any secondary causes for tachycardia. Her chest x-ray is positive for left lower lobe pneumonia so this is the most likely explanation for the tachycardia. It does not require any intervention at this time. -     CBC with Differential/Platelet; Future -     Comprehensive metabolic panel; Future -     Thyroid Panel With TSH; Future -     hCG, quantitative, pregnancy; Future -     D-dimer, quantitative (not at [facility]); Future  Cough- chest x-ray is positive for left lower lobe pneumonia -     DG Chest 2 View; Future -      HYDROcodone-homatropine (HYCODAN) 5-1.5 MG/5ML syrup; Take 5 mLs by mouth every 8 (eight) hours as needed for cough.  Increased pulse rate -     EKG 12-Lead  Viral upper respiratory tract infection- will treat the cough with Hycodan -     HYDROcodone-homatropine (HYCODAN) 5-1.5 MG/5ML syrup; Take 5 mLs by mouth every 8 (eight) hours as needed for cough.  Community acquired pneumonia of left lower lobe of lung ([place])- she is allergic to penicillin so will treat this infection with moxifloxacin -     moxifloxacin (AVELOX) 400 MG tablet; Take 1 tablet (400 mg total) by mouth daily.   I have discontinued Ms. Racicot's fluconazole. I am also having her start on HYDROcodone-homatropine and moxifloxacin. Additionally, I am having her maintain her nortriptyline.  Meds ordered this encounter  Medications  . nortriptyline (PAMELOR) 10 MG capsule    Sig: Take 2 capsules QHS, then increase by 1 capsule every 7 days as tolerated until reaching 5 capsules QHS.  Marland Kitchen HYDROcodone-homatropine (HYCODAN) 5-1.5 MG/5ML syrup    Sig: Take 5 mLs by mouth every 8 (eight) hours as needed for cough.    Dispense:  120 mL    Refill:  0  .  moxifloxacin (AVELOX) 400 MG tablet    Sig: Take 1 tablet (400 mg total) by mouth daily.    Dispense:  7 tablet    Refill:  0     Follow-up: Return in about 3 weeks (around 06/30/2016).  [name], MD

## 2016-06-09 NOTE — Patient Instructions (Signed)
Cough, Adult Coughing is a reflex that clears your throat and your airways. Coughing helps to heal and protect your lungs. It is normal to cough occasionally, but a cough that happens with other symptoms or lasts a long time may be a sign of a condition that needs treatment. A cough may last only 2-3 weeks (acute), or it may last longer than 8 weeks (chronic). What are the causes? Coughing is commonly caused by:  Breathing in substances that irritate your lungs.  A viral or bacterial respiratory infection.  Allergies.  Asthma.  Postnasal drip.  Smoking.  Acid backing up from the stomach into the esophagus (gastroesophageal reflux).  Certain medicines.  Chronic lung problems, including COPD (or rarely, lung cancer).  Other medical conditions such as heart failure.  Follow these instructions at home: Pay attention to any changes in your symptoms. Take these actions to help with your discomfort:  Take medicines only as told by your health care provider. ? If you were prescribed an antibiotic medicine, take it as told by your health care provider. Do not stop taking the antibiotic even if you start to feel better. ? Talk with your health care provider before you take a cough suppressant medicine.  Drink enough fluid to keep your urine clear or pale yellow.  If the air is dry, use a cold steam vaporizer or humidifier in your bedroom or your home to help loosen secretions.  Avoid anything that causes you to cough at work or at home.  If your cough is worse at night, try sleeping in a semi-upright position.  Avoid cigarette smoke. If you smoke, quit smoking. If you need help quitting, ask your health care provider.  Avoid caffeine.  Avoid alcohol.  Rest as needed.  Contact a health care provider if:  You have new symptoms.  You cough up pus.  Your cough does not get better after 2-3 weeks, or your cough gets worse.  You cannot control your cough with suppressant  medicines and you are losing sleep.  You develop pain that is getting worse or pain that is not controlled with pain medicines.  You have a fever.  You have unexplained weight loss.  You have night sweats. Get help right away if:  You cough up blood.  You have difficulty breathing.  Your heartbeat is very fast. This information is not intended to replace advice given to you by your health care provider. Make sure you discuss any questions you have with your health care provider. Document Released: 12/13/2010 Document Revised: 11/22/2015 Document Reviewed: 08/23/2014 Elsevier Interactive Patient Education  2017 [employer]  

## 2016-06-10 ENCOUNTER — Encounter: Payer: Self-pay | Admitting: Internal Medicine

## 2016-06-10 LAB — THYROID PANEL WITH TSH
Free Thyroxine Index: 2.9 (ref 1.4–3.8)
T3 Uptake: 31 % (ref 22–35)
T4, Total: 9.3 ug/dL (ref 4.5–12.0)
TSH: 1.59 m[IU]/L

## 2016-06-10 LAB — D-DIMER, QUANTITATIVE: D-Dimer, Quant: 0.58 ug{FEU}/mL — ABNORMAL HIGH

## 2016-06-10 MED ORDER — MOXIFLOXACIN HCL 400 MG PO TABS: 400.0000 mg | ORAL_TABLET | Freq: Every day | ORAL | 0 refills | Status: AC

## 2016-07-01 ENCOUNTER — Ambulatory Visit (INDEPENDENT_AMBULATORY_CARE_PROVIDER_SITE_OTHER): Payer: Federal, State, Local not specified - PPO | Admitting: Internal Medicine

## 2016-07-01 VITALS — BP 122/74 | HR 106 | Temp 98.0°F | Resp 20 | Wt 130.0 lb

## 2016-07-01 DIAGNOSIS — J069 Acute upper respiratory infection, unspecified: Secondary | ICD-10-CM

## 2016-07-01 MED ORDER — AZITHROMYCIN 250 MG PO TABS: ORAL_TABLET | ORAL | 1 refills | Status: DC

## 2016-07-01 MED ORDER — PROMETHAZINE-CODEINE 6.25-10 MG/5ML PO SYRP
5.0000 mL | ORAL_SOLUTION | Freq: Four times a day (QID) | ORAL | 0 refills | Status: AC | PRN
Start: 2016-07-01 — End: 2016-07-11

## 2016-07-01 NOTE — Progress Notes (Signed)
Pre visit review using our clinic review tool, if applicable. No additional management support is needed unless otherwise documented below in the visit note. 

## 2016-07-01 NOTE — Patient Instructions (Signed)
Please take all new medication as prescribed - the antibiotic, and cough medicine if needed  Please continue all other medications as before, and refills have been done if requested.  Please have the pharmacy call with any other refills you may need.  Please keep your appointments with your specialists as you may have planned     

## 2016-07-01 NOTE — Progress Notes (Signed)
   Subjective:    Patient ID: Michele Mills, female    DOB: 05-16-1976, 41 y.o.   MRN: [id number]  HPI   Here with 2-3 days acute onset fever, facial pain, pressure, headache, general weakness and malaise, and greenish d/c, with mild ST and cough, but pt denies chest pain, wheezing, increased sob or doe, orthopnea, PND, increased LE swelling, palpitations, dizziness or syncope. No other new history Past Medical History:  Diagnosis Date  . Seizure disorder ([place])    No past surgical history on file.  reports that she has never smoked. She has never used smokeless tobacco. She reports that she does not drink alcohol or use drugs. family history includes Stroke in her mother. Allergies  Allergen Reactions  . Omeprazole Rash  . Penicillins Rash   Current Outpatient Prescriptions on File Prior to Visit  Medication Sig Dispense Refill  . nortriptyline (PAMELOR) 10 MG capsule Take 2 capsules QHS, then increase by 1 capsule every 7 days as tolerated until reaching 5 capsules QHS.     No current facility-administered medications on file prior to visit.    Review of Systems All otherwise neg per pt     Objective:   Physical Exam BP 122/74   Pulse (!) 106   Temp 98 F (36.7 C) (Oral)   Resp 20   Wt 130 lb (59 kg)   LMP 05/19/2016 (Approximate)   SpO2 97%   BMI 22.31 kg/m  VS noted, mild ill Constitutional: Pt appears in no apparent distress HENT: Head: NCAT.  Right Ear: External ear normal.  Left Ear: External ear normal.  Bilat tm's with mild erythema.  Max sinus areas mild tender.  Pharynx with mild erythema, no exudate Eyes: . Pupils are equal, round, and reactive to light. Conjunctivae and EOM are normal Neck: Normal range of motion. Neck supple.  Cardiovascular: Normal rate and regular rhythm.   Pulmonary/Chest: Effort normal and breath sounds without rales or wheezing.  Abd:  Soft, NT, ND, + BS Neurological: Pt is alert. Not confused , motor grossly intact Skin: Skin is  warm. No rash, no LE edema Psychiatric: Pt behavior is normal. No agitation.  No other new exam findings    Assessment & Plan:

## 2016-07-02 DIAGNOSIS — M6281 Muscle weakness (generalized): Secondary | ICD-10-CM | POA: Diagnosis not present

## 2016-07-02 DIAGNOSIS — K5900 Constipation, unspecified: Secondary | ICD-10-CM | POA: Diagnosis not present

## 2016-07-02 DIAGNOSIS — N94819 Vulvodynia, unspecified: Secondary | ICD-10-CM | POA: Diagnosis not present

## 2016-07-02 DIAGNOSIS — M62838 Other muscle spasm: Secondary | ICD-10-CM | POA: Diagnosis not present

## 2016-07-06 NOTE — Assessment & Plan Note (Signed)
Mild to mod, for antibx course,  to f/u any worsening symptoms or concerns 

## 2016-07-07 DIAGNOSIS — K5900 Constipation, unspecified: Secondary | ICD-10-CM | POA: Diagnosis not present

## 2016-07-07 DIAGNOSIS — M6281 Muscle weakness (generalized): Secondary | ICD-10-CM | POA: Diagnosis not present

## 2016-07-07 DIAGNOSIS — M62838 Other muscle spasm: Secondary | ICD-10-CM | POA: Diagnosis not present

## 2016-07-07 DIAGNOSIS — N94819 Vulvodynia, unspecified: Secondary | ICD-10-CM | POA: Diagnosis not present

## 2016-07-11 DIAGNOSIS — D485 Neoplasm of uncertain behavior of skin: Secondary | ICD-10-CM | POA: Diagnosis not present

## 2016-07-11 DIAGNOSIS — Z1283 Encounter for screening for malignant neoplasm of skin: Secondary | ICD-10-CM | POA: Diagnosis not present

## 2016-07-11 DIAGNOSIS — D225 Melanocytic nevi of trunk: Secondary | ICD-10-CM | POA: Diagnosis not present

## 2016-07-15 DIAGNOSIS — N94819 Vulvodynia, unspecified: Secondary | ICD-10-CM | POA: Diagnosis not present

## 2016-07-15 DIAGNOSIS — M6281 Muscle weakness (generalized): Secondary | ICD-10-CM | POA: Diagnosis not present

## 2016-07-15 DIAGNOSIS — M62838 Other muscle spasm: Secondary | ICD-10-CM | POA: Diagnosis not present

## 2016-07-15 DIAGNOSIS — K5900 Constipation, unspecified: Secondary | ICD-10-CM | POA: Diagnosis not present

## 2016-07-21 ENCOUNTER — Other Ambulatory Visit: Payer: Self-pay | Admitting: Obstetrics & Gynecology

## 2016-07-21 DIAGNOSIS — Z01419 Encounter for gynecological examination (general) (routine) without abnormal findings: Secondary | ICD-10-CM | POA: Diagnosis not present

## 2016-07-21 DIAGNOSIS — N898 Other specified noninflammatory disorders of vagina: Secondary | ICD-10-CM | POA: Diagnosis not present

## 2016-07-21 DIAGNOSIS — Z6822 Body mass index (BMI) 22.0-22.9, adult: Secondary | ICD-10-CM | POA: Diagnosis not present

## 2016-07-21 LAB — LIPID PANEL
Cholesterol: 207 mg/dL — AB (ref 0–200)
HDL: 69 mg/dL (ref 35–70)
LDL Cholesterol: 120 mg/dL
LDl/HDL Ratio: 3
Triglycerides: 88 mg/dL (ref 40–160)

## 2016-07-21 LAB — BASIC METABOLIC PANEL
BUN: 13 mg/dL (ref 4–21)
Creatinine: 0.6 mg/dL (ref 0.5–1.1)
GLUCOSE: 80 mg/dL
Potassium: 4.1 mmol/L (ref 3.4–5.3)
Sodium: 136 mmol/L — AB (ref 137–147)

## 2016-07-21 LAB — HEPATIC FUNCTION PANEL
ALK PHOS: 43 U/L (ref 25–125)
ALT: 14 U/L (ref 7–35)
AST: 15 U/L (ref 13–35)
BILIRUBIN, TOTAL: 0.7 mg/dL

## 2016-07-21 LAB — TSH: TSH: 2.75 u[IU]/mL (ref 0.41–5.90)

## 2016-07-21 LAB — CBC AND DIFFERENTIAL
HCT: 42 % (ref 36–46)
Hemoglobin: 13.7 g/dL (ref 12.0–16.0)
Platelets: 343 10*3/uL (ref 150–399)
WBC: 5.8 10*3/mL

## 2016-07-22 DIAGNOSIS — M6281 Muscle weakness (generalized): Secondary | ICD-10-CM | POA: Diagnosis not present

## 2016-07-22 DIAGNOSIS — N94819 Vulvodynia, unspecified: Secondary | ICD-10-CM | POA: Diagnosis not present

## 2016-07-22 DIAGNOSIS — K5900 Constipation, unspecified: Secondary | ICD-10-CM | POA: Diagnosis not present

## 2016-07-22 DIAGNOSIS — M62838 Other muscle spasm: Secondary | ICD-10-CM | POA: Diagnosis not present

## 2016-07-30 DIAGNOSIS — K5900 Constipation, unspecified: Secondary | ICD-10-CM | POA: Diagnosis not present

## 2016-07-30 DIAGNOSIS — M62838 Other muscle spasm: Secondary | ICD-10-CM | POA: Diagnosis not present

## 2016-07-30 DIAGNOSIS — M6281 Muscle weakness (generalized): Secondary | ICD-10-CM | POA: Diagnosis not present

## 2016-07-30 DIAGNOSIS — N94819 Vulvodynia, unspecified: Secondary | ICD-10-CM | POA: Diagnosis not present

## 2016-07-31 DIAGNOSIS — Z79899 Other long term (current) drug therapy: Secondary | ICD-10-CM | POA: Diagnosis not present

## 2016-07-31 DIAGNOSIS — N903 Dysplasia of vulva, unspecified: Secondary | ICD-10-CM | POA: Diagnosis not present

## 2016-07-31 DIAGNOSIS — Z6822 Body mass index (BMI) 22.0-22.9, adult: Secondary | ICD-10-CM | POA: Diagnosis not present

## 2016-07-31 DIAGNOSIS — N900 Mild vulvar dysplasia: Secondary | ICD-10-CM | POA: Diagnosis not present

## 2016-07-31 DIAGNOSIS — N94819 Vulvodynia, unspecified: Secondary | ICD-10-CM | POA: Diagnosis not present

## 2016-07-31 DIAGNOSIS — N809 Endometriosis, unspecified: Secondary | ICD-10-CM | POA: Diagnosis not present

## 2016-08-05 DIAGNOSIS — M62838 Other muscle spasm: Secondary | ICD-10-CM | POA: Diagnosis not present

## 2016-08-05 DIAGNOSIS — K5900 Constipation, unspecified: Secondary | ICD-10-CM | POA: Diagnosis not present

## 2016-08-05 DIAGNOSIS — M6281 Muscle weakness (generalized): Secondary | ICD-10-CM | POA: Diagnosis not present

## 2016-08-05 DIAGNOSIS — N94819 Vulvodynia, unspecified: Secondary | ICD-10-CM | POA: Diagnosis not present

## 2016-08-12 ENCOUNTER — Encounter: Payer: Self-pay | Admitting: Internal Medicine

## 2016-08-12 DIAGNOSIS — M62838 Other muscle spasm: Secondary | ICD-10-CM | POA: Diagnosis not present

## 2016-08-12 DIAGNOSIS — K5900 Constipation, unspecified: Secondary | ICD-10-CM | POA: Diagnosis not present

## 2016-08-12 DIAGNOSIS — N94819 Vulvodynia, unspecified: Secondary | ICD-10-CM | POA: Diagnosis not present

## 2016-08-12 DIAGNOSIS — M6281 Muscle weakness (generalized): Secondary | ICD-10-CM | POA: Diagnosis not present

## 2016-08-19 DIAGNOSIS — N94819 Vulvodynia, unspecified: Secondary | ICD-10-CM | POA: Diagnosis not present

## 2016-08-19 DIAGNOSIS — M62838 Other muscle spasm: Secondary | ICD-10-CM | POA: Diagnosis not present

## 2016-08-19 DIAGNOSIS — K5900 Constipation, unspecified: Secondary | ICD-10-CM | POA: Diagnosis not present

## 2016-08-19 DIAGNOSIS — M6281 Muscle weakness (generalized): Secondary | ICD-10-CM | POA: Diagnosis not present

## 2016-08-27 DIAGNOSIS — K5900 Constipation, unspecified: Secondary | ICD-10-CM | POA: Diagnosis not present

## 2016-08-27 DIAGNOSIS — M6281 Muscle weakness (generalized): Secondary | ICD-10-CM | POA: Diagnosis not present

## 2016-08-27 DIAGNOSIS — M62838 Other muscle spasm: Secondary | ICD-10-CM | POA: Diagnosis not present

## 2016-08-27 DIAGNOSIS — N94819 Vulvodynia, unspecified: Secondary | ICD-10-CM | POA: Diagnosis not present

## 2016-09-03 DIAGNOSIS — N94819 Vulvodynia, unspecified: Secondary | ICD-10-CM | POA: Diagnosis not present

## 2016-09-03 DIAGNOSIS — M62838 Other muscle spasm: Secondary | ICD-10-CM | POA: Diagnosis not present

## 2016-09-03 DIAGNOSIS — K5900 Constipation, unspecified: Secondary | ICD-10-CM | POA: Diagnosis not present

## 2016-09-03 DIAGNOSIS — M6281 Muscle weakness (generalized): Secondary | ICD-10-CM | POA: Diagnosis not present

## 2016-09-08 DIAGNOSIS — K080 Exfoliation of teeth due to systemic causes: Secondary | ICD-10-CM | POA: Diagnosis not present

## 2016-09-29 DIAGNOSIS — N94819 Vulvodynia, unspecified: Secondary | ICD-10-CM | POA: Diagnosis not present

## 2016-09-29 DIAGNOSIS — M6281 Muscle weakness (generalized): Secondary | ICD-10-CM | POA: Diagnosis not present

## 2016-09-29 DIAGNOSIS — K5900 Constipation, unspecified: Secondary | ICD-10-CM | POA: Diagnosis not present

## 2016-09-29 DIAGNOSIS — M62838 Other muscle spasm: Secondary | ICD-10-CM | POA: Diagnosis not present

## 2016-10-07 DIAGNOSIS — N94819 Vulvodynia, unspecified: Secondary | ICD-10-CM | POA: Diagnosis not present

## 2016-10-07 DIAGNOSIS — M6281 Muscle weakness (generalized): Secondary | ICD-10-CM | POA: Diagnosis not present

## 2016-10-07 DIAGNOSIS — M62838 Other muscle spasm: Secondary | ICD-10-CM | POA: Diagnosis not present

## 2016-10-07 DIAGNOSIS — K5900 Constipation, unspecified: Secondary | ICD-10-CM | POA: Diagnosis not present

## 2016-10-13 DIAGNOSIS — M62838 Other muscle spasm: Secondary | ICD-10-CM | POA: Diagnosis not present

## 2016-10-13 DIAGNOSIS — N94819 Vulvodynia, unspecified: Secondary | ICD-10-CM | POA: Diagnosis not present

## 2016-10-13 DIAGNOSIS — M6281 Muscle weakness (generalized): Secondary | ICD-10-CM | POA: Diagnosis not present

## 2016-10-13 DIAGNOSIS — K5900 Constipation, unspecified: Secondary | ICD-10-CM | POA: Diagnosis not present

## 2016-10-21 DIAGNOSIS — K5900 Constipation, unspecified: Secondary | ICD-10-CM | POA: Diagnosis not present

## 2016-10-21 DIAGNOSIS — M62838 Other muscle spasm: Secondary | ICD-10-CM | POA: Diagnosis not present

## 2016-10-21 DIAGNOSIS — M6281 Muscle weakness (generalized): Secondary | ICD-10-CM | POA: Diagnosis not present

## 2016-10-21 DIAGNOSIS — N94819 Vulvodynia, unspecified: Secondary | ICD-10-CM | POA: Diagnosis not present

## 2016-10-27 DIAGNOSIS — N94819 Vulvodynia, unspecified: Secondary | ICD-10-CM | POA: Diagnosis not present

## 2016-10-27 DIAGNOSIS — M62838 Other muscle spasm: Secondary | ICD-10-CM | POA: Diagnosis not present

## 2016-10-27 DIAGNOSIS — M6281 Muscle weakness (generalized): Secondary | ICD-10-CM | POA: Diagnosis not present

## 2016-10-27 DIAGNOSIS — K5900 Constipation, unspecified: Secondary | ICD-10-CM | POA: Diagnosis not present

## 2016-10-29 DIAGNOSIS — D485 Neoplasm of uncertain behavior of skin: Secondary | ICD-10-CM | POA: Diagnosis not present

## 2016-10-29 DIAGNOSIS — Z1283 Encounter for screening for malignant neoplasm of skin: Secondary | ICD-10-CM | POA: Diagnosis not present

## 2016-10-29 DIAGNOSIS — I781 Nevus, non-neoplastic: Secondary | ICD-10-CM | POA: Diagnosis not present

## 2016-11-25 DIAGNOSIS — N94819 Vulvodynia, unspecified: Secondary | ICD-10-CM | POA: Diagnosis not present

## 2016-11-25 DIAGNOSIS — K5900 Constipation, unspecified: Secondary | ICD-10-CM | POA: Diagnosis not present

## 2016-11-25 DIAGNOSIS — M62838 Other muscle spasm: Secondary | ICD-10-CM | POA: Diagnosis not present

## 2016-11-25 DIAGNOSIS — M6281 Muscle weakness (generalized): Secondary | ICD-10-CM | POA: Diagnosis not present

## 2016-12-22 DIAGNOSIS — M6281 Muscle weakness (generalized): Secondary | ICD-10-CM | POA: Diagnosis not present

## 2016-12-22 DIAGNOSIS — K5900 Constipation, unspecified: Secondary | ICD-10-CM | POA: Diagnosis not present

## 2016-12-22 DIAGNOSIS — N94819 Vulvodynia, unspecified: Secondary | ICD-10-CM | POA: Diagnosis not present

## 2016-12-22 DIAGNOSIS — M62838 Other muscle spasm: Secondary | ICD-10-CM | POA: Diagnosis not present

## 2016-12-25 DIAGNOSIS — N809 Endometriosis, unspecified: Secondary | ICD-10-CM | POA: Diagnosis not present

## 2016-12-25 DIAGNOSIS — N94819 Vulvodynia, unspecified: Secondary | ICD-10-CM | POA: Diagnosis not present

## 2016-12-25 DIAGNOSIS — R198 Other specified symptoms and signs involving the digestive system and abdomen: Secondary | ICD-10-CM | POA: Diagnosis not present

## 2016-12-26 ENCOUNTER — Other Ambulatory Visit: Payer: Self-pay | Admitting: Obstetrics and Gynecology

## 2016-12-26 DIAGNOSIS — N809 Endometriosis, unspecified: Secondary | ICD-10-CM

## 2017-01-02 ENCOUNTER — Other Ambulatory Visit: Payer: Self-pay | Admitting: Obstetrics and Gynecology

## 2017-01-02 DIAGNOSIS — N809 Endometriosis, unspecified: Secondary | ICD-10-CM

## 2017-01-27 DIAGNOSIS — K5900 Constipation, unspecified: Secondary | ICD-10-CM | POA: Diagnosis not present

## 2017-01-27 DIAGNOSIS — M6281 Muscle weakness (generalized): Secondary | ICD-10-CM | POA: Diagnosis not present

## 2017-01-27 DIAGNOSIS — M62838 Other muscle spasm: Secondary | ICD-10-CM | POA: Diagnosis not present

## 2017-01-27 DIAGNOSIS — R278 Other lack of coordination: Secondary | ICD-10-CM | POA: Diagnosis not present

## 2017-01-30 ENCOUNTER — Ambulatory Visit
Admission: RE | Admit: 2017-01-30 | Discharge: 2017-01-30 | Disposition: A | Discharge status: Home / Self Care | Payer: Federal, State, Local not specified - PPO | Source: Ambulatory Visit | Attending: Obstetrics and Gynecology | Admitting: Obstetrics and Gynecology

## 2017-01-30 DIAGNOSIS — N809 Endometriosis, unspecified: Secondary | ICD-10-CM

## 2017-01-30 DIAGNOSIS — N839 Noninflammatory disorder of ovary, fallopian tube and broad ligament, unspecified: Secondary | ICD-10-CM | POA: Diagnosis not present

## 2017-02-02 ENCOUNTER — Other Ambulatory Visit (HOSPITAL_COMMUNITY): Payer: Self-pay | Admitting: Obstetrics and Gynecology

## 2017-02-02 DIAGNOSIS — N80129 Deep endometriosis of ovary, unspecified ovary: Secondary | ICD-10-CM

## 2017-02-02 DIAGNOSIS — D259 Leiomyoma of uterus, unspecified: Secondary | ICD-10-CM

## 2017-02-02 DIAGNOSIS — N801 Endometriosis of ovary: Secondary | ICD-10-CM

## 2017-02-17 DIAGNOSIS — M62838 Other muscle spasm: Secondary | ICD-10-CM | POA: Diagnosis not present

## 2017-02-17 DIAGNOSIS — K5900 Constipation, unspecified: Secondary | ICD-10-CM | POA: Diagnosis not present

## 2017-02-17 DIAGNOSIS — N94819 Vulvodynia, unspecified: Secondary | ICD-10-CM | POA: Diagnosis not present

## 2017-02-17 DIAGNOSIS — M6281 Muscle weakness (generalized): Secondary | ICD-10-CM | POA: Diagnosis not present

## 2017-02-19 DIAGNOSIS — Z6822 Body mass index (BMI) 22.0-22.9, adult: Secondary | ICD-10-CM | POA: Diagnosis not present

## 2017-02-19 DIAGNOSIS — N903 Dysplasia of vulva, unspecified: Secondary | ICD-10-CM | POA: Diagnosis not present

## 2017-02-19 DIAGNOSIS — N809 Endometriosis, unspecified: Secondary | ICD-10-CM | POA: Diagnosis not present

## 2017-03-09 ENCOUNTER — Ambulatory Visit (HOSPITAL_COMMUNITY)
Admission: RE | Admit: 2017-03-09 | Discharge: 2017-03-09 | Disposition: A | Discharge status: Home / Self Care | Payer: Federal, State, Local not specified - PPO | Source: Ambulatory Visit | Attending: Obstetrics and Gynecology | Admitting: Obstetrics and Gynecology

## 2017-03-09 DIAGNOSIS — N83291 Other ovarian cyst, right side: Secondary | ICD-10-CM | POA: Insufficient documentation

## 2017-03-09 DIAGNOSIS — D259 Leiomyoma of uterus, unspecified: Secondary | ICD-10-CM

## 2017-03-09 DIAGNOSIS — N80129 Deep endometriosis of ovary, unspecified ovary: Secondary | ICD-10-CM

## 2017-03-09 DIAGNOSIS — N801 Endometriosis of ovary: Secondary | ICD-10-CM | POA: Insufficient documentation

## 2017-03-11 DIAGNOSIS — M62838 Other muscle spasm: Secondary | ICD-10-CM | POA: Diagnosis not present

## 2017-03-11 DIAGNOSIS — K5900 Constipation, unspecified: Secondary | ICD-10-CM | POA: Diagnosis not present

## 2017-03-11 DIAGNOSIS — R278 Other lack of coordination: Secondary | ICD-10-CM | POA: Diagnosis not present

## 2017-03-11 DIAGNOSIS — M6281 Muscle weakness (generalized): Secondary | ICD-10-CM | POA: Diagnosis not present

## 2017-03-26 DIAGNOSIS — N83201 Unspecified ovarian cyst, right side: Secondary | ICD-10-CM | POA: Insufficient documentation

## 2017-03-26 DIAGNOSIS — N809 Endometriosis, unspecified: Secondary | ICD-10-CM | POA: Diagnosis not present

## 2017-03-26 DIAGNOSIS — M791 Myalgia: Secondary | ICD-10-CM | POA: Diagnosis not present

## 2017-03-26 DIAGNOSIS — N83202 Unspecified ovarian cyst, left side: Secondary | ICD-10-CM

## 2017-03-26 DIAGNOSIS — N94819 Vulvodynia, unspecified: Secondary | ICD-10-CM | POA: Diagnosis not present

## 2017-03-27 DIAGNOSIS — D225 Melanocytic nevi of trunk: Secondary | ICD-10-CM | POA: Diagnosis not present

## 2017-03-27 DIAGNOSIS — Z1283 Encounter for screening for malignant neoplasm of skin: Secondary | ICD-10-CM | POA: Diagnosis not present

## 2017-04-01 DIAGNOSIS — K080 Exfoliation of teeth due to systemic causes: Secondary | ICD-10-CM | POA: Diagnosis not present

## 2017-04-08 DIAGNOSIS — N94819 Vulvodynia, unspecified: Secondary | ICD-10-CM | POA: Diagnosis not present

## 2017-04-08 DIAGNOSIS — K5900 Constipation, unspecified: Secondary | ICD-10-CM | POA: Diagnosis not present

## 2017-04-08 DIAGNOSIS — M6281 Muscle weakness (generalized): Secondary | ICD-10-CM | POA: Diagnosis not present

## 2017-04-08 DIAGNOSIS — M62838 Other muscle spasm: Secondary | ICD-10-CM | POA: Diagnosis not present

## 2017-04-27 DIAGNOSIS — M6281 Muscle weakness (generalized): Secondary | ICD-10-CM | POA: Diagnosis not present

## 2017-04-27 DIAGNOSIS — N94819 Vulvodynia, unspecified: Secondary | ICD-10-CM | POA: Diagnosis not present

## 2017-04-27 DIAGNOSIS — M62838 Other muscle spasm: Secondary | ICD-10-CM | POA: Diagnosis not present

## 2017-04-27 DIAGNOSIS — K5900 Constipation, unspecified: Secondary | ICD-10-CM | POA: Diagnosis not present

## 2017-05-07 ENCOUNTER — Other Ambulatory Visit: Payer: Self-pay | Admitting: Obstetrics and Gynecology

## 2017-05-07 DIAGNOSIS — N809 Endometriosis, unspecified: Secondary | ICD-10-CM

## 2017-05-07 DIAGNOSIS — N83202 Unspecified ovarian cyst, left side: Principal | ICD-10-CM

## 2017-05-07 DIAGNOSIS — N83201 Unspecified ovarian cyst, right side: Secondary | ICD-10-CM

## 2017-05-12 DIAGNOSIS — B9689 Other specified bacterial agents as the cause of diseases classified elsewhere: Secondary | ICD-10-CM | POA: Diagnosis not present

## 2017-05-12 DIAGNOSIS — K5900 Constipation, unspecified: Secondary | ICD-10-CM | POA: Diagnosis not present

## 2017-05-12 DIAGNOSIS — L02229 Furuncle of trunk, unspecified: Secondary | ICD-10-CM | POA: Diagnosis not present

## 2017-05-12 DIAGNOSIS — M62838 Other muscle spasm: Secondary | ICD-10-CM | POA: Diagnosis not present

## 2017-05-12 DIAGNOSIS — M6281 Muscle weakness (generalized): Secondary | ICD-10-CM | POA: Diagnosis not present

## 2017-05-12 DIAGNOSIS — N94819 Vulvodynia, unspecified: Secondary | ICD-10-CM | POA: Diagnosis not present

## 2017-05-13 ENCOUNTER — Ambulatory Visit
Admission: RE | Admit: 2017-05-13 | Discharge: 2017-05-13 | Disposition: A | Discharge status: Home / Self Care | Payer: Federal, State, Local not specified - PPO | Source: Ambulatory Visit | Attending: Obstetrics and Gynecology | Admitting: Obstetrics and Gynecology

## 2017-05-13 DIAGNOSIS — N809 Endometriosis, unspecified: Secondary | ICD-10-CM

## 2017-05-13 DIAGNOSIS — N83201 Unspecified ovarian cyst, right side: Secondary | ICD-10-CM

## 2017-05-13 DIAGNOSIS — N83202 Unspecified ovarian cyst, left side: Principal | ICD-10-CM

## 2017-05-26 DIAGNOSIS — M62838 Other muscle spasm: Secondary | ICD-10-CM | POA: Diagnosis not present

## 2017-05-26 DIAGNOSIS — M6281 Muscle weakness (generalized): Secondary | ICD-10-CM | POA: Diagnosis not present

## 2017-05-26 DIAGNOSIS — N94819 Vulvodynia, unspecified: Secondary | ICD-10-CM | POA: Diagnosis not present

## 2017-05-26 DIAGNOSIS — K5900 Constipation, unspecified: Secondary | ICD-10-CM | POA: Diagnosis not present

## 2017-07-07 DIAGNOSIS — M62838 Other muscle spasm: Secondary | ICD-10-CM | POA: Diagnosis not present

## 2017-07-07 DIAGNOSIS — M6281 Muscle weakness (generalized): Secondary | ICD-10-CM | POA: Diagnosis not present

## 2017-07-07 DIAGNOSIS — R278 Other lack of coordination: Secondary | ICD-10-CM | POA: Diagnosis not present

## 2017-07-07 DIAGNOSIS — K5900 Constipation, unspecified: Secondary | ICD-10-CM | POA: Diagnosis not present

## 2017-07-23 DIAGNOSIS — R102 Pelvic and perineal pain: Secondary | ICD-10-CM | POA: Diagnosis not present

## 2017-07-23 DIAGNOSIS — N809 Endometriosis, unspecified: Secondary | ICD-10-CM | POA: Diagnosis not present

## 2017-07-23 DIAGNOSIS — R198 Other specified symptoms and signs involving the digestive system and abdomen: Secondary | ICD-10-CM | POA: Diagnosis not present

## 2017-07-23 DIAGNOSIS — N94819 Vulvodynia, unspecified: Secondary | ICD-10-CM | POA: Diagnosis not present

## 2017-08-10 DIAGNOSIS — Z6823 Body mass index (BMI) 23.0-23.9, adult: Secondary | ICD-10-CM | POA: Diagnosis not present

## 2017-08-10 DIAGNOSIS — Z124 Encounter for screening for malignant neoplasm of cervix: Secondary | ICD-10-CM | POA: Diagnosis not present

## 2017-08-10 DIAGNOSIS — Z01419 Encounter for gynecological examination (general) (routine) without abnormal findings: Secondary | ICD-10-CM | POA: Diagnosis not present

## 2017-08-10 DIAGNOSIS — Z1231 Encounter for screening mammogram for malignant neoplasm of breast: Secondary | ICD-10-CM | POA: Diagnosis not present

## 2017-08-10 DIAGNOSIS — Z0000 Encounter for general adult medical examination without abnormal findings: Secondary | ICD-10-CM | POA: Diagnosis not present

## 2017-08-10 LAB — CBC AND DIFFERENTIAL
HCT: 40 (ref 36–46)
Hemoglobin: 13.6 (ref 12.0–16.0)
Neutrophils Absolute: 7
PLATELETS: 334 (ref 150–399)
WBC: 8.9

## 2017-08-10 LAB — HM MAMMOGRAPHY

## 2017-08-10 LAB — BASIC METABOLIC PANEL
BUN: 8 (ref 4–21)
CREATININE: 0.6 (ref 0.5–1.1)
GLUCOSE: 86
POTASSIUM: 4.6 (ref 3.4–5.3)
Sodium: 138 (ref 137–147)

## 2017-08-10 LAB — LIPID PANEL
CHOLESTEROL: 178 (ref 0–200)
HDL: 63 (ref 35–70)
LDL Cholesterol: 89
Triglycerides: 131 (ref 40–160)

## 2017-08-10 LAB — HM PAP SMEAR: HM Pap smear: NORMAL

## 2017-08-10 LAB — TSH: TSH: 2.07 (ref 0.41–5.90)

## 2017-08-10 LAB — HEPATIC FUNCTION PANEL
ALT: 13 (ref 7–35)
AST: 12 — AB (ref 13–35)
Alkaline Phosphatase: 52 (ref 25–125)
Bilirubin, Total: 0.5

## 2017-08-11 DIAGNOSIS — M62838 Other muscle spasm: Secondary | ICD-10-CM | POA: Diagnosis not present

## 2017-08-11 DIAGNOSIS — N94819 Vulvodynia, unspecified: Secondary | ICD-10-CM | POA: Diagnosis not present

## 2017-08-11 DIAGNOSIS — M6281 Muscle weakness (generalized): Secondary | ICD-10-CM | POA: Diagnosis not present

## 2017-08-11 DIAGNOSIS — K5900 Constipation, unspecified: Secondary | ICD-10-CM | POA: Diagnosis not present

## 2017-08-13 ENCOUNTER — Encounter: Payer: Self-pay | Admitting: Internal Medicine

## 2017-08-13 NOTE — Progress Notes (Signed)
Result abstracted and sent to scan ° °

## 2017-08-13 NOTE — Progress Notes (Signed)
Error

## 2017-08-27 DIAGNOSIS — R102 Pelvic and perineal pain: Secondary | ICD-10-CM | POA: Diagnosis not present

## 2017-08-27 DIAGNOSIS — N903 Dysplasia of vulva, unspecified: Secondary | ICD-10-CM | POA: Diagnosis not present

## 2017-08-27 DIAGNOSIS — Z6824 Body mass index (BMI) 24.0-24.9, adult: Secondary | ICD-10-CM | POA: Diagnosis not present

## 2017-09-07 DIAGNOSIS — N94819 Vulvodynia, unspecified: Secondary | ICD-10-CM | POA: Diagnosis not present

## 2017-09-07 DIAGNOSIS — M62838 Other muscle spasm: Secondary | ICD-10-CM | POA: Diagnosis not present

## 2017-09-07 DIAGNOSIS — M6281 Muscle weakness (generalized): Secondary | ICD-10-CM | POA: Diagnosis not present

## 2017-09-07 DIAGNOSIS — K5900 Constipation, unspecified: Secondary | ICD-10-CM | POA: Diagnosis not present

## 2017-10-13 DIAGNOSIS — D225 Melanocytic nevi of trunk: Secondary | ICD-10-CM | POA: Diagnosis not present

## 2017-10-13 DIAGNOSIS — I781 Nevus, non-neoplastic: Secondary | ICD-10-CM | POA: Diagnosis not present

## 2017-10-13 DIAGNOSIS — L700 Acne vulgaris: Secondary | ICD-10-CM | POA: Diagnosis not present

## 2017-10-26 DIAGNOSIS — K080 Exfoliation of teeth due to systemic causes: Secondary | ICD-10-CM | POA: Diagnosis not present

## 2017-11-02 DIAGNOSIS — K5900 Constipation, unspecified: Secondary | ICD-10-CM | POA: Diagnosis not present

## 2017-11-02 DIAGNOSIS — M6281 Muscle weakness (generalized): Secondary | ICD-10-CM | POA: Diagnosis not present

## 2017-11-02 DIAGNOSIS — M62838 Other muscle spasm: Secondary | ICD-10-CM | POA: Diagnosis not present

## 2017-11-02 DIAGNOSIS — N94819 Vulvodynia, unspecified: Secondary | ICD-10-CM | POA: Diagnosis not present

## 2017-11-03 ENCOUNTER — Other Ambulatory Visit (INDEPENDENT_AMBULATORY_CARE_PROVIDER_SITE_OTHER): Payer: Federal, State, Local not specified - PPO

## 2017-11-03 ENCOUNTER — Encounter: Payer: Self-pay | Admitting: Internal Medicine

## 2017-11-03 ENCOUNTER — Ambulatory Visit: Payer: Federal, State, Local not specified - PPO | Admitting: Internal Medicine

## 2017-11-03 VITALS — BP 152/102 | HR 110 | Temp 98.4°F | Resp 16 | Ht 64.0 in | Wt 133.8 lb

## 2017-11-03 DIAGNOSIS — R000 Tachycardia, unspecified: Secondary | ICD-10-CM

## 2017-11-03 DIAGNOSIS — I10 Essential (primary) hypertension: Secondary | ICD-10-CM | POA: Insufficient documentation

## 2017-11-03 LAB — URINALYSIS, ROUTINE W REFLEX MICROSCOPIC
Bilirubin Urine: NEGATIVE
Hgb urine dipstick: NEGATIVE
Ketones, ur: NEGATIVE
Leukocytes, UA: NEGATIVE
NITRITE: NEGATIVE
PH: 7 (ref 5.0–8.0)
SPECIFIC GRAVITY, URINE: 1.01 (ref 1.000–1.030)
Total Protein, Urine: NEGATIVE
URINE GLUCOSE: NEGATIVE
Urobilinogen, UA: 0.2 (ref 0.0–1.0)
WBC UA: NONE SEEN (ref 0–?)

## 2017-11-03 LAB — BASIC METABOLIC PANEL
BUN: 9 mg/dL (ref 6–23)
CO2: 28 mEq/L (ref 19–32)
CREATININE: 0.77 mg/dL (ref 0.40–1.20)
Calcium: 9.1 mg/dL (ref 8.4–10.5)
Chloride: 102 mEq/L (ref 96–112)
GFR: 87.91 mL/min (ref >60.00)
GLUCOSE: 93 mg/dL (ref 70–99)
POTASSIUM: 3.7 meq/L (ref 3.5–5.1)
Sodium: 139 mEq/L (ref 135–145)

## 2017-11-03 LAB — VITAMIN D 25 HYDROXY (VIT D DEFICIENCY, FRACTURES): VITD: 71.42 ng/mL (ref 30.00–100.00)

## 2017-11-03 MED ORDER — NEBIVOLOL HCL 5 MG PO TABS: 5.0000 mg | ORAL_TABLET | Freq: Every day | ORAL | 0 refills | Status: DC

## 2017-11-03 NOTE — Progress Notes (Signed)
Subjective:  Patient ID: Michele Mills, female    DOB: 1975/11/17  Age: 42 y.o. MRN: [id number]  CC: Hypertension   HPI Michele Mills presents for concerns about an elevated blood pressure.  She saw her gynecologist recently and was found to have a blood pressure of about 150/110.  Both parents have hypertension and her mother had a stroke in her 45s.  She complains that over the last few months she has noticed an elevated pulse and that her heart pounds intermittently.  She does not think her heart beats irregularly and she denies any extra or skipped beats.  She denies headache, blurred vision, chest pain, shortness of breath, edema, or fatigue.  Outpatient Medications Prior to Visit  Medication Sig Dispense Refill  . clobetasol ointment (TEMOVATE) 0.05 % Apply topically.    . cyclobenzaprine (FLEXERIL) 5 MG tablet cyclobenzaprine 5 mg tablet    . azithromycin (ZITHROMAX Z-PAK) 250 MG tablet 2 tab by mouth day 1, then 1 per day 6 tablet 1  . nortriptyline (PAMELOR) 10 MG capsule Take 2 capsules QHS, then increase by 1 capsule every 7 days as tolerated until reaching 5 capsules QHS.     No facility-administered medications prior to visit.     ROS Review of Systems  Constitutional: Negative for diaphoresis, fatigue and unexpected weight change.  HENT: Negative.  Negative for trouble swallowing.   Eyes: Negative for visual disturbance.  Respiratory: Negative for cough, chest tightness, shortness of breath and wheezing.   Cardiovascular: Positive for palpitations. Negative for chest pain and leg swelling.  Gastrointestinal: Negative for abdominal pain, constipation, diarrhea, nausea and vomiting.  Endocrine: Negative.   Genitourinary: Negative.  Negative for difficulty urinating, hematuria and urgency.  Musculoskeletal: Negative.   Skin: Negative.   Neurological: Negative.  Negative for dizziness, weakness and light-headedness.  Hematological: Negative for adenopathy. Does not  bruise/bleed easily.  Psychiatric/Behavioral: Negative.     Objective:  BP (!) 152/102 (BP Location: Left Arm, Patient Position: Sitting, Cuff Size: Normal)   Pulse (!) 110   Temp 98.4 F (36.9 C) (Oral)   Resp 16   Ht 5\' 4"  (1.626 m)   Wt 133 lb 12 oz (60.7 kg)   LMP 10/10/2017   SpO2 99%   BMI 22.96 kg/m   BP Readings from Last 3 Encounters:  11/03/17 (!) 152/102  07/01/16 122/74  06/09/16 140/90    Wt Readings from Last 3 Encounters:  11/03/17 133 lb 12 oz (60.7 kg)  07/01/16 130 lb (59 kg)  06/09/16 129 lb (58.5 kg)    Physical Exam  Constitutional: She is oriented to person, place, and time.  HENT:  Mouth/Throat: No oropharyngeal exudate.  Eyes: Conjunctivae are normal. No scleral icterus.  Neck: Normal range of motion. Neck supple. No JVD present. No thyromegaly present.  Cardiovascular: Regular rhythm. Tachycardia present. PMI is not displaced. Exam reveals no gallop.  No murmur heard. EKG ---  Sinus  Tachycardia  -RSR(V1) -nondiagnostic.   -Nonspecific ST depression  -Nondiagnostic.   ABNORMAL    Pulmonary/Chest: Effort normal and breath sounds normal. No stridor. No respiratory distress. She has no wheezes. She has no rales.  Abdominal: Soft. Bowel sounds are normal. She exhibits no distension and no mass. There is no tenderness.  Musculoskeletal: Normal range of motion. She exhibits no edema, tenderness or deformity.  Lymphadenopathy:    She has no cervical adenopathy.  Neurological: She is alert and oriented to person, place, and time.  Skin: Skin is warm and  dry.  Psychiatric: She has a normal mood and affect. Her behavior is normal. Judgment and thought content normal.  Vitals reviewed.   Lab Results  Component Value Date   WBC 8.9 08/10/2017   HGB 13.6 08/10/2017   HCT 40 08/10/2017   PLT 334 08/10/2017   GLUCOSE 93 11/03/2017   CHOL 178 08/10/2017   TRIG 131 08/10/2017   HDL 63 08/10/2017   LDLCALC 89 08/10/2017   ALT 13 08/10/2017    AST 12 (A) 08/10/2017   NA 139 11/03/2017   K 3.7 11/03/2017   CL 102 11/03/2017   CREATININE 0.77 11/03/2017   BUN 9 11/03/2017   CO2 28 11/03/2017   TSH 3.29 11/03/2017    US Pelvic Complete With Transvaginal  Result Date: 05/13/2017 CLINICAL DATA:  Follow-up ovarian cyst, endometriosis EXAM: TRANSABDOMINAL AND TRANSVAGINAL ULTRASOUND OF PELVIS TECHNIQUE: Both transabdominal and transvaginal ultrasound examinations of the pelvis were performed. Transabdominal technique was performed for global imaging of the pelvis including uterus, ovaries, adnexal regions, and pelvic cul-de-sac. It was necessary to proceed with endovaginal exam following the transabdominal exam to visualize the endometrium and bilateral ovaries. COMPARISON:  MR pelvis dated 03/09/2017. Pelvic ultrasound dated 01/30/2017. FINDINGS: Uterus Measurements: 8.9 x 5.9 x 6.5 cm. No fibroids or other mass visualized. Endometrium Thickness: 9 mm.  No focal abnormality visualized. Right ovary Measurements: 4.1 x 1.7 x 1.9 cm. Prior dominant 6.7 cm cyst/follicle has resolved. Currently, a dominant 1.6 cm follicle is present, physiologic Left ovary Measurements: 4.9 x 2.4 x 2.5 cm. Dominant 3.1 x 2.2 x 2.5 cm cyst/follicle, likely physiologic. Other findings No abnormal free fluid. IMPRESSION: Prior dominant 6.7 cm right ovarian cyst/follicle has resolved. Dominant 3.1 cm left ovarian cyst/follicle on the current study is likely physiologic. Negative pelvic ultrasound. Electronically Signed   By: Julian Hy M.D.   On: 05/13/2017 14:52    Assessment & Plan:   Michele Mills was seen today for hypertension.  Diagnoses and all orders for this visit:  Hypertension, unspecified type- She has new onset hypertension with an elevated heart rate.  Her EKG is negative for LVH or ischemia.  Her labs are negative for secondary causes or endorgan damage.  I will also screen for pheochromocytoma and primary aldosteronism. -     EKG 12-Lead -      Basic metabolic panel; Future -     Thyroid Panel With TSH; Future -     Urinalysis, Routine w reflex microscopic; Future -     VITAMIN D 25 Hydroxy (Vit-D Deficiency, Fractures); Future -     Metanephrines, plasma; Future -     Aldosterone + renin activity w/ ratio; Future -     nebivolol (BYSTOLIC) 5 MG tablet; Take 1 tablet (5 mg total) by mouth daily.  Tachycardia- Her thyroid function and other screening labs are normal.  I will screen her for pheochromocytoma with a set of plasma metanephrines and will also screen for aldosteronism. -     EKG 12-Lead -     Thyroid Panel With TSH; Future -     Metanephrines, plasma; Future -     Aldosterone + renin activity w/ ratio; Future -     nebivolol (BYSTOLIC) 5 MG tablet; Take 1 tablet (5 mg total) by mouth daily.   I have discontinued Damoni Birkey's nortriptyline and azithromycin. I am also having her start on nebivolol. Additionally, I am having her maintain her clobetasol ointment and cyclobenzaprine.  Meds ordered this encounter  Medications  .  nebivolol (BYSTOLIC) 5 MG tablet    Sig: Take 1 tablet (5 mg total) by mouth daily.    Dispense:  49 tablet    Refill:  0     Follow-up: Return in about 3 weeks (around 11/24/2017).  [name], MD

## 2017-11-03 NOTE — Patient Instructions (Signed)

## 2017-11-05 ENCOUNTER — Encounter: Payer: Self-pay | Admitting: Internal Medicine

## 2017-11-08 LAB — THYROID PANEL WITH TSH
FREE THYROXINE INDEX: 2.9 (ref 1.4–3.8)
T3 UPTAKE: 34 % (ref 22–35)
T4 TOTAL: 8.4 ug/dL (ref 5.1–11.9)
TSH: 3.29 mIU/L

## 2017-11-08 LAB — ALDOSTERONE + RENIN ACTIVITY W/ RATIO
ALDO / PRA Ratio: 4.7 Ratio (ref 0.9–28.9)
Aldosterone: 15 ng/dL
Renin Activity: 3.17 ng/mL/h (ref 0.25–5.82)

## 2017-11-08 LAB — METANEPHRINES, PLASMA
Metanephrine, Free: <25 pg/mL (ref <=57)
Normetanephrine, Free: 89 pg/mL (ref <=148)
TOTAL METANEPHRINES-PLASMA: 89 pg/mL (ref <=205)

## 2017-11-12 DIAGNOSIS — M5413 Radiculopathy, cervicothoracic region: Secondary | ICD-10-CM | POA: Diagnosis not present

## 2017-11-12 DIAGNOSIS — R51 Headache: Secondary | ICD-10-CM | POA: Diagnosis not present

## 2017-11-12 DIAGNOSIS — M5412 Radiculopathy, cervical region: Secondary | ICD-10-CM | POA: Diagnosis not present

## 2017-11-12 DIAGNOSIS — M7912 Myalgia of auxiliary muscles, head and neck: Secondary | ICD-10-CM | POA: Diagnosis not present

## 2017-11-14 ENCOUNTER — Encounter: Payer: Self-pay | Admitting: Internal Medicine

## 2017-11-16 DIAGNOSIS — M7912 Myalgia of auxiliary muscles, head and neck: Secondary | ICD-10-CM | POA: Diagnosis not present

## 2017-11-16 DIAGNOSIS — R51 Headache: Secondary | ICD-10-CM | POA: Diagnosis not present

## 2017-11-16 DIAGNOSIS — M5412 Radiculopathy, cervical region: Secondary | ICD-10-CM | POA: Diagnosis not present

## 2017-11-16 DIAGNOSIS — M5413 Radiculopathy, cervicothoracic region: Secondary | ICD-10-CM | POA: Diagnosis not present

## 2017-11-18 ENCOUNTER — Ambulatory Visit: Payer: Federal, State, Local not specified - PPO | Admitting: Internal Medicine

## 2017-11-18 ENCOUNTER — Encounter: Payer: Self-pay | Admitting: Internal Medicine

## 2017-11-18 VITALS — BP 144/96 | HR 78 | Temp 97.9°F | Resp 16 | Ht 64.0 in | Wt 132.0 lb

## 2017-11-18 DIAGNOSIS — I10 Essential (primary) hypertension: Secondary | ICD-10-CM

## 2017-11-18 DIAGNOSIS — R000 Tachycardia, unspecified: Secondary | ICD-10-CM

## 2017-11-18 MED ORDER — NEBIVOLOL HCL 5 MG PO TABS: 5.0000 mg | ORAL_TABLET | Freq: Every day | ORAL | 0 refills | Status: DC

## 2017-11-18 MED ORDER — HYDROCHLOROTHIAZIDE 12.5 MG PO CAPS: 12.5000 mg | ORAL_CAPSULE | Freq: Every day | ORAL | 0 refills | Status: DC

## 2017-11-18 NOTE — Patient Instructions (Signed)

## 2017-11-18 NOTE — Progress Notes (Signed)
Subjective:  Patient ID: Michele Mills, female    DOB: [birth year]  Age: 42 y.o. MRN: [id number]  CC: Hypertension   HPI Michele Mills presents for f/up - She tells me her blood pressure is better than it was during her last visit but is still not adequately well controlled.  The palpitations have resolved on nebivolol.  Screening for catecholamine excess and primary aldosteronism were both normal.  Outpatient Medications Prior to Visit  Medication Sig Dispense Refill  . clobetasol ointment (TEMOVATE) 0.05 % Apply topically.    . fluconazole (DIFLUCAN) 150 MG tablet Take 150 mg by mouth once a week.    . nebivolol (BYSTOLIC) 5 MG tablet Take 1 tablet (5 mg total) by mouth daily. 49 tablet 0  . cyclobenzaprine (FLEXERIL) 5 MG tablet cyclobenzaprine 5 mg tablet     No facility-administered medications prior to visit.     ROS Review of Systems  Constitutional: Negative.  Negative for diaphoresis, fatigue and unexpected weight change.  Eyes: Negative for visual disturbance.  Respiratory: Negative.  Negative for cough, chest tightness, shortness of breath and wheezing.   Cardiovascular: Negative for chest pain, palpitations and leg swelling.  Gastrointestinal: Negative.  Negative for abdominal pain, diarrhea, nausea and vomiting.  Endocrine: Negative.   Genitourinary: Negative.  Negative for difficulty urinating, dysuria, hematuria and urgency.  Musculoskeletal: Negative.  Negative for arthralgias and myalgias.  Skin: Negative.   Neurological: Negative.  Negative for dizziness, weakness and light-headedness.  Hematological: Negative for adenopathy. Does not bruise/bleed easily.  Psychiatric/Behavioral: Negative.     Objective:  BP (!) 144/96 (BP Location: Left Arm, Patient Position: Sitting, Cuff Size: Normal)   Pulse 78   Temp 97.9 F (36.6 C) (Oral)   Resp 16   Ht 5\' 4"  (1.626 m)   Wt 132 lb (59.9 kg)   LMP 11/10/2017 Comment: strerile due to endometriosis  SpO2 99%   BMI  22.66 kg/m   BP Readings from Last 3 Encounters:  11/18/17 (!) 144/96  11/03/17 (!) 152/102  07/01/16 122/74    Wt Readings from Last 3 Encounters:  11/18/17 132 lb (59.9 kg)  11/03/17 133 lb 12 oz (60.7 kg)  07/01/16 130 lb (59 kg)    Physical Exam  Constitutional: She is oriented to person, place, and time. No distress.  HENT:  Mouth/Throat: Oropharynx is clear and moist. No oropharyngeal exudate.  Eyes: Conjunctivae are normal. No scleral icterus.  Neck: Normal range of motion. Neck supple. No JVD present. No thyromegaly present.  Cardiovascular: Normal rate, regular rhythm and normal heart sounds. Exam reveals no gallop.  No murmur heard. Pulmonary/Chest: Effort normal and breath sounds normal. No respiratory distress. She has no wheezes. She has no rales.  Abdominal: Soft. Bowel sounds are normal. She exhibits no mass. There is no tenderness.  Musculoskeletal: Normal range of motion. She exhibits no edema, tenderness or deformity.  Lymphadenopathy:    She has no cervical adenopathy.  Neurological: She is alert and oriented to person, place, and time.  Skin: Skin is warm and dry. She is not diaphoretic.  Vitals reviewed.   Lab Results  Component Value Date   WBC 8.9 08/10/2017   HGB 13.6 08/10/2017   HCT 40 08/10/2017   PLT 334 08/10/2017   GLUCOSE 93 11/03/2017   CHOL 178 08/10/2017   TRIG 131 08/10/2017   HDL 63 08/10/2017   LDLCALC 89 08/10/2017   ALT 13 08/10/2017   AST 12 (A) 08/10/2017   NA 139 11/03/2017  K 3.7 11/03/2017   CL 102 11/03/2017   CREATININE 0.77 11/03/2017   BUN 9 11/03/2017   CO2 28 11/03/2017   TSH 3.29 11/03/2017    US Pelvic Complete With Transvaginal  Result Date: 05/13/2017 CLINICAL DATA:  Follow-up ovarian cyst, endometriosis EXAM: TRANSABDOMINAL AND TRANSVAGINAL ULTRASOUND OF PELVIS TECHNIQUE: Both transabdominal and transvaginal ultrasound examinations of the pelvis were performed. Transabdominal technique was performed for  global imaging of the pelvis including uterus, ovaries, adnexal regions, and pelvic cul-de-sac. It was necessary to proceed with endovaginal exam following the transabdominal exam to visualize the endometrium and bilateral ovaries. COMPARISON:  MR pelvis dated 03/09/2017. Pelvic ultrasound dated 01/30/2017. FINDINGS: Uterus Measurements: 8.9 x 5.9 x 6.5 cm. No fibroids or other mass visualized. Endometrium Thickness: 9 mm.  No focal abnormality visualized. Right ovary Measurements: 4.1 x 1.7 x 1.9 cm. Prior dominant 6.7 cm cyst/follicle has resolved. Currently, a dominant 1.6 cm follicle is present, physiologic Left ovary Measurements: 4.9 x 2.4 x 2.5 cm. Dominant 3.1 x 2.2 x 2.5 cm cyst/follicle, likely physiologic. Other findings No abnormal free fluid. IMPRESSION: Prior dominant 6.7 cm right ovarian cyst/follicle has resolved. Dominant 3.1 cm left ovarian cyst/follicle on the current study is likely physiologic. Negative pelvic ultrasound. Electronically Signed   By: Julian Hy M.D.   On: 05/13/2017 14:52    Assessment & Plan:   Michele Mills was seen today for hypertension.  Diagnoses and all orders for this visit:  Essential hypertension- Her blood pressure is better but still not adequately well controlled.  The recent evaluation for secondary causes was unremarkable.  Her sx's have resolved with nebivolol.  Will add a thiazide diuretic for additional blood pressure control. -     hydrochlorothiazide (MICROZIDE) 12.5 MG capsule; Take 1 capsule (12.5 mg total) by mouth daily. -     nebivolol (BYSTOLIC) 5 MG tablet; Take 1 tablet (5 mg total) by mouth daily.  Hypertension, unspecified type  Tachycardia- Her pulse is normal now, will cont nebivolol at the current dose   I have discontinued Michele Mills's cyclobenzaprine. I am also having her start on hydrochlorothiazide. Additionally, I am having her maintain her clobetasol ointment, fluconazole, and nebivolol.  Meds ordered this encounter    Medications  . hydrochlorothiazide (MICROZIDE) 12.5 MG capsule    Sig: Take 1 capsule (12.5 mg total) by mouth daily.    Dispense:  90 capsule    Refill:  0  . nebivolol (BYSTOLIC) 5 MG tablet    Sig: Take 1 tablet (5 mg total) by mouth daily.    Dispense:  90 tablet    Refill:  0     Follow-up: Return in about 3 months (around 02/18/2018).  [name], MD

## 2017-11-19 DIAGNOSIS — M7912 Myalgia of auxiliary muscles, head and neck: Secondary | ICD-10-CM | POA: Diagnosis not present

## 2017-11-19 DIAGNOSIS — M5412 Radiculopathy, cervical region: Secondary | ICD-10-CM | POA: Diagnosis not present

## 2017-11-19 DIAGNOSIS — M5413 Radiculopathy, cervicothoracic region: Secondary | ICD-10-CM | POA: Diagnosis not present

## 2017-11-19 DIAGNOSIS — R51 Headache: Secondary | ICD-10-CM | POA: Diagnosis not present

## 2017-11-24 DIAGNOSIS — R51 Headache: Secondary | ICD-10-CM | POA: Diagnosis not present

## 2017-11-24 DIAGNOSIS — M7912 Myalgia of auxiliary muscles, head and neck: Secondary | ICD-10-CM | POA: Diagnosis not present

## 2017-11-24 DIAGNOSIS — M5412 Radiculopathy, cervical region: Secondary | ICD-10-CM | POA: Diagnosis not present

## 2017-11-24 DIAGNOSIS — M5413 Radiculopathy, cervicothoracic region: Secondary | ICD-10-CM | POA: Diagnosis not present

## 2017-11-26 ENCOUNTER — Ambulatory Visit: Payer: Federal, State, Local not specified - PPO | Admitting: Internal Medicine

## 2017-11-26 DIAGNOSIS — R51 Headache: Secondary | ICD-10-CM | POA: Diagnosis not present

## 2017-11-26 DIAGNOSIS — M5412 Radiculopathy, cervical region: Secondary | ICD-10-CM | POA: Diagnosis not present

## 2017-11-26 DIAGNOSIS — M5413 Radiculopathy, cervicothoracic region: Secondary | ICD-10-CM | POA: Diagnosis not present

## 2017-11-26 DIAGNOSIS — M7912 Myalgia of auxiliary muscles, head and neck: Secondary | ICD-10-CM | POA: Diagnosis not present

## 2017-12-01 DIAGNOSIS — M5412 Radiculopathy, cervical region: Secondary | ICD-10-CM | POA: Diagnosis not present

## 2017-12-01 DIAGNOSIS — M5413 Radiculopathy, cervicothoracic region: Secondary | ICD-10-CM | POA: Diagnosis not present

## 2017-12-01 DIAGNOSIS — R51 Headache: Secondary | ICD-10-CM | POA: Diagnosis not present

## 2017-12-01 DIAGNOSIS — M7912 Myalgia of auxiliary muscles, head and neck: Secondary | ICD-10-CM | POA: Diagnosis not present

## 2017-12-03 DIAGNOSIS — M7912 Myalgia of auxiliary muscles, head and neck: Secondary | ICD-10-CM | POA: Diagnosis not present

## 2017-12-03 DIAGNOSIS — M5412 Radiculopathy, cervical region: Secondary | ICD-10-CM | POA: Diagnosis not present

## 2017-12-03 DIAGNOSIS — M5413 Radiculopathy, cervicothoracic region: Secondary | ICD-10-CM | POA: Diagnosis not present

## 2017-12-03 DIAGNOSIS — R51 Headache: Secondary | ICD-10-CM | POA: Diagnosis not present

## 2017-12-07 DIAGNOSIS — M47812 Spondylosis without myelopathy or radiculopathy, cervical region: Secondary | ICD-10-CM | POA: Diagnosis not present

## 2017-12-07 DIAGNOSIS — M50323 Other cervical disc degeneration at C6-C7 level: Secondary | ICD-10-CM | POA: Diagnosis not present

## 2017-12-07 DIAGNOSIS — M9901 Segmental and somatic dysfunction of cervical region: Secondary | ICD-10-CM | POA: Diagnosis not present

## 2017-12-07 DIAGNOSIS — M50322 Other cervical disc degeneration at C5-C6 level: Secondary | ICD-10-CM | POA: Diagnosis not present

## 2017-12-10 DIAGNOSIS — M50323 Other cervical disc degeneration at C6-C7 level: Secondary | ICD-10-CM | POA: Diagnosis not present

## 2017-12-10 DIAGNOSIS — M47812 Spondylosis without myelopathy or radiculopathy, cervical region: Secondary | ICD-10-CM | POA: Diagnosis not present

## 2017-12-10 DIAGNOSIS — M9901 Segmental and somatic dysfunction of cervical region: Secondary | ICD-10-CM | POA: Diagnosis not present

## 2017-12-10 DIAGNOSIS — M50322 Other cervical disc degeneration at C5-C6 level: Secondary | ICD-10-CM | POA: Diagnosis not present

## 2017-12-15 ENCOUNTER — Encounter: Payer: Self-pay | Admitting: Internal Medicine

## 2017-12-15 ENCOUNTER — Ambulatory Visit: Payer: Federal, State, Local not specified - PPO | Admitting: Internal Medicine

## 2017-12-15 DIAGNOSIS — J069 Acute upper respiratory infection, unspecified: Secondary | ICD-10-CM

## 2017-12-15 MED ORDER — BENZONATATE 200 MG PO CAPS: 200.0000 mg | ORAL_CAPSULE | Freq: Three times a day (TID) | ORAL | 0 refills | Status: DC | PRN

## 2017-12-15 NOTE — Assessment & Plan Note (Signed)
Rx for tessalon perles. Reassurance given. Reminded about typical 7-10 day course with worst symptoms around day 4-5. Okay with tylenol for headaches and fevers/chills. Advised contagious until 24 hours after last fever. Continue zyrtec daily. Call back on Friday if no improvement.

## 2017-12-15 NOTE — Progress Notes (Signed)
   Subjective:    Patient ID: Michele Mills, female    DOB: 08-Nov-1975, 42 y.o.   MRN: [id number]  HPI The patient is a 42 YO female coming in for fevers/chills and cough. Started about 3-4 days ago. She overall felt worsening this weekend but mild improvement today. She is coughing (non-productive) and headaches. She was taking tylenol this weekend for the chills but none today. She denies sinus pressure or ear pain. She took zyrtec once last night. Denies other medicines. Denies SOB but fatigue. Some achiness. Denies sick contacts.   Review of Systems  Constitutional: Positive for activity change, appetite change, chills, fatigue and fever. Negative for unexpected weight change.  HENT: Positive for congestion, postnasal drip, rhinorrhea and sinus pressure. Negative for ear discharge, ear pain, sinus pain, sneezing, sore throat, tinnitus, trouble swallowing and voice change.   Eyes: Negative.   Respiratory: Positive for cough. Negative for chest tightness, shortness of breath and wheezing.   Cardiovascular: Negative.   Gastrointestinal: Negative.   Musculoskeletal: Positive for myalgias.  Neurological: Positive for headaches.      Objective:   Physical Exam  Constitutional: She is oriented to person, place, and time. She appears well-developed and well-nourished.  HENT:  Head: Normocephalic and atraumatic.  Oropharynx with redness and clear drainage, nose with swollen turbinates, TMs normal bilaterally  Eyes: EOM are normal.  Neck: Normal range of motion. No thyromegaly present.  Cardiovascular: Normal rate and regular rhythm.  Pulmonary/Chest: Effort normal and breath sounds normal. No respiratory distress. She has no wheezes. She has no rales.  Abdominal: Soft.  Musculoskeletal: She exhibits tenderness.  Lymphadenopathy:    She has no cervical adenopathy.  Neurological: She is alert and oriented to person, place, and time.  Skin: Skin is warm and dry.   Vitals:   12/15/17 0915    BP: 130/82  Pulse: 98  Temp: 99.7 F (37.6 C)  TempSrc: Oral  SpO2: 98%  Weight: 133 lb (60.3 kg)  Height: 5\' 4"  (1.626 m)      Assessment & Plan:

## 2017-12-15 NOTE — Patient Instructions (Signed)
We have sent in the cough medicine tessalon that you can take up to 3 times per day.   Call us back on Thursday or Friday if you are not feeling better.   Keep taking zyrtec to help you get better quicker.    Upper Respiratory Infection, Adult Most upper respiratory infections (URIs) are caused by a virus. A URI affects the nose, throat, and upper air passages. The most common type of URI is often called "the common cold." Follow these instructions at home:  Take medicines only as told by your doctor.  Gargle warm saltwater or take cough drops to comfort your throat as told by your doctor.  Use a warm mist humidifier or inhale steam from a shower to increase air moisture. This may make it easier to breathe.  Drink enough fluid to keep your pee (urine) clear or pale yellow.  Eat soups and other clear broths.  Have a healthy diet.  Rest as needed.  Go back to work when your fever is gone or your doctor says it is okay. ? You may need to stay home longer to avoid giving your URI to others. ? You can also wear a face mask and wash your hands often to prevent spread of the virus.  Use your inhaler more if you have asthma.  Do not use any tobacco products, including cigarettes, chewing tobacco, or electronic cigarettes. If you need help quitting, ask your doctor. Contact a doctor if:  You are getting worse, not better.  Your symptoms are not helped by medicine.  You have chills.  You are getting more short of breath.  You have brown or red mucus.  You have yellow or brown discharge from your nose.  You have pain in your face, especially when you bend forward.  You have a fever.  You have puffy (swollen) neck glands.  You have pain while swallowing.  You have white areas in the back of your throat. Get help right away if:  You have very bad or constant: ? Headache. ? Ear pain. ? Pain in your forehead, behind your eyes, and over your cheekbones (sinus  pain). ? Chest pain.  You have long-lasting (chronic) lung disease and any of the following: ? Wheezing. ? Long-lasting cough. ? Coughing up blood. ? A change in your usual mucus.  You have a stiff neck.  You have changes in your: ? Vision. ? Hearing. ? Thinking. ? Mood. This information is not intended to replace advice given to you by your health care provider. Make sure you discuss any questions you have with your health care provider. Document Released: 12/03/2007 Document Revised: 02/17/2016 Document Reviewed: 09/21/2013 Elsevier Interactive Patient Education  2018 [employer].

## 2017-12-17 ENCOUNTER — Encounter: Payer: Self-pay | Admitting: Internal Medicine

## 2017-12-18 MED ORDER — AZITHROMYCIN 250 MG PO TABS: ORAL_TABLET | ORAL | 0 refills | Status: DC

## 2018-01-25 ENCOUNTER — Encounter: Payer: Self-pay | Admitting: Internal Medicine

## 2018-01-25 ENCOUNTER — Ambulatory Visit: Payer: Federal, State, Local not specified - PPO | Admitting: Internal Medicine

## 2018-01-25 VITALS — BP 124/88 | HR 75 | Temp 97.8°F | Ht 64.0 in | Wt 131.5 lb

## 2018-01-25 DIAGNOSIS — B3731 Acute candidiasis of vulva and vagina: Secondary | ICD-10-CM | POA: Insufficient documentation

## 2018-01-25 DIAGNOSIS — N926 Irregular menstruation, unspecified: Secondary | ICD-10-CM | POA: Insufficient documentation

## 2018-01-25 DIAGNOSIS — N979 Female infertility, unspecified: Secondary | ICD-10-CM | POA: Insufficient documentation

## 2018-01-25 DIAGNOSIS — N3281 Overactive bladder: Secondary | ICD-10-CM | POA: Insufficient documentation

## 2018-01-25 DIAGNOSIS — I10 Essential (primary) hypertension: Secondary | ICD-10-CM | POA: Diagnosis not present

## 2018-01-25 DIAGNOSIS — N831 Corpus luteum cyst of ovary, unspecified side: Secondary | ICD-10-CM | POA: Insufficient documentation

## 2018-01-25 DIAGNOSIS — R300 Dysuria: Secondary | ICD-10-CM | POA: Insufficient documentation

## 2018-01-25 DIAGNOSIS — B373 Candidiasis of vulva and vagina: Secondary | ICD-10-CM | POA: Insufficient documentation

## 2018-01-25 MED ORDER — HYDROCHLOROTHIAZIDE 12.5 MG PO CAPS: 12.5000 mg | ORAL_CAPSULE | Freq: Every day | ORAL | 0 refills | Status: DC

## 2018-01-25 NOTE — Patient Instructions (Signed)

## 2018-01-25 NOTE — Progress Notes (Signed)
Subjective:  Patient ID: Michele Mills, female    DOB: April 28, 1976  Age: 42 y.o. MRN: [id number]  CC: Hypertension   HPI Abygayle Deltoro presents for f/up - She tells me her blood pressure has been very well controlled.  She has had a few systolic blood pressures down to 110 and felt dizzy and lightheaded.  She also notices that when she is trying to exercise her heart rate will not go 100 and this has caused fatigue.  She denies chest pain, SOB, or DOE.  Outpatient Medications Prior to Visit  Medication Sig Dispense Refill  . clobetasol ointment (TEMOVATE) 0.05 % Apply topically.    . fluconazole (DIFLUCAN) 150 MG tablet Take 150 mg by mouth once a week.    . hydrochlorothiazide (MICROZIDE) 12.5 MG capsule Take 1 capsule (12.5 mg total) by mouth daily. 90 capsule 0  . nebivolol (BYSTOLIC) 5 MG tablet Take 1 tablet (5 mg total) by mouth daily. 90 tablet 0  . azithromycin (ZITHROMAX) 250 MG tablet Day 1 take 2 pills, day 2-5 take 1 pill daily. 6 tablet 0  . benzonatate (TESSALON) 200 MG capsule Take 1 capsule (200 mg total) by mouth 3 (three) times daily as needed. 60 capsule 0   No facility-administered medications prior to visit.     ROS Review of Systems  Constitutional: Positive for fatigue. Negative for appetite change, diaphoresis and unexpected weight change.  HENT: Negative.   Eyes: Negative for visual disturbance.  Respiratory: Negative for cough, chest tightness, shortness of breath and wheezing.   Cardiovascular: Negative for chest pain, palpitations and leg swelling.  Gastrointestinal: Negative for abdominal pain, constipation, diarrhea, nausea and vomiting.  Endocrine: Negative.   Genitourinary: Negative.  Negative for difficulty urinating.  Musculoskeletal: Negative.   Skin: Negative.  Negative for color change.  Neurological: Positive for dizziness and light-headedness. Negative for weakness and headaches.  Hematological: Negative for adenopathy. Does not bruise/bleed  easily.  Psychiatric/Behavioral: Negative.     Objective:  BP 124/88 (BP Location: Left Arm, Patient Position: Sitting, Cuff Size: Normal)   Pulse 75   Temp 97.8 F (36.6 C) (Oral)   Ht 5\' 4"  (1.626 m)   Wt 131 lb 8 oz (59.6 kg)   SpO2 99%   BMI 22.57 kg/m   BP Readings from Last 3 Encounters:  01/25/18 124/88  12/15/17 130/82  11/18/17 (!) 144/96    Wt Readings from Last 3 Encounters:  01/25/18 131 lb 8 oz (59.6 kg)  12/15/17 133 lb (60.3 kg)  11/18/17 132 lb (59.9 kg)    Physical Exam  Constitutional: She is oriented to person, place, and time. No distress.  HENT:  Mouth/Throat: Oropharynx is clear and moist. No oropharyngeal exudate.  Eyes: No scleral icterus.  Neck: Normal range of motion. Neck supple. No JVD present. No thyromegaly present.  Cardiovascular: Normal rate, regular rhythm and normal heart sounds.  No murmur heard. Pulmonary/Chest: Effort normal and breath sounds normal. She has no wheezes. She has no rhonchi. She has no rales.  Abdominal: Soft. Normal appearance and bowel sounds are normal. There is no hepatosplenomegaly. There is no tenderness. No hernia.  Musculoskeletal: Normal range of motion. She exhibits no edema, tenderness or deformity.  Lymphadenopathy:    She has no cervical adenopathy.  Neurological: She is alert and oriented to person, place, and time.  Skin: Skin is warm and dry. She is not diaphoretic. No pallor.  Vitals reviewed.   Lab Results  Component Value Date   WBC  8.9 08/10/2017   HGB 13.6 08/10/2017   HCT 40 08/10/2017   PLT 334 08/10/2017   GLUCOSE 93 11/03/2017   CHOL 178 08/10/2017   TRIG 131 08/10/2017   HDL 63 08/10/2017   LDLCALC 89 08/10/2017   ALT 13 08/10/2017   AST 12 (A) 08/10/2017   NA 139 11/03/2017   K 3.7 11/03/2017   CL 102 11/03/2017   CREATININE 0.77 11/03/2017   BUN 9 11/03/2017   CO2 28 11/03/2017   TSH 3.29 11/03/2017    US Pelvic Complete With Transvaginal  Result Date:  05/13/2017 CLINICAL DATA:  Follow-up ovarian cyst, endometriosis EXAM: TRANSABDOMINAL AND TRANSVAGINAL ULTRASOUND OF PELVIS TECHNIQUE: Both transabdominal and transvaginal ultrasound examinations of the pelvis were performed. Transabdominal technique was performed for global imaging of the pelvis including uterus, ovaries, adnexal regions, and pelvic cul-de-sac. It was necessary to proceed with endovaginal exam following the transabdominal exam to visualize the endometrium and bilateral ovaries. COMPARISON:  MR pelvis dated 03/09/2017. Pelvic ultrasound dated 01/30/2017. FINDINGS: Uterus Measurements: 8.9 x 5.9 x 6.5 cm. No fibroids or other mass visualized. Endometrium Thickness: 9 mm.  No focal abnormality visualized. Right ovary Measurements: 4.1 x 1.7 x 1.9 cm. Prior dominant 6.7 cm cyst/follicle has resolved. Currently, a dominant 1.6 cm follicle is present, physiologic Left ovary Measurements: 4.9 x 2.4 x 2.5 cm. Dominant 3.1 x 2.2 x 2.5 cm cyst/follicle, likely physiologic. Other findings No abnormal free fluid. IMPRESSION: Prior dominant 6.7 cm right ovarian cyst/follicle has resolved. Dominant 3.1 cm left ovarian cyst/follicle on the current study is likely physiologic. Negative pelvic ultrasound. Electronically Signed   By: Julian Hy M.D.   On: 05/13/2017 14:52    Assessment & Plan:   Trinna was seen today for hypertension.  Diagnoses and all orders for this visit:  Essential hypertension- Her blood pressure is over controlled.  She is also not tolerating the beta-blocker very well.  I have asked her to stop taking nebivolol and will continue the hydrochlorothiazide at the current dose. -     hydrochlorothiazide (MICROZIDE) 12.5 MG capsule; Take 1 capsule (12.5 mg total) by mouth daily.   I have discontinued [name]'s nebivolol, benzonatate, and azithromycin. I am also having her maintain her clobetasol ointment, fluconazole, and hydrochlorothiazide.  Meds ordered this  encounter  Medications  . hydrochlorothiazide (MICROZIDE) 12.5 MG capsule    Sig: Take 1 capsule (12.5 mg total) by mouth daily.    Dispense:  90 capsule    Refill:  0     Follow-up: Return in about 3 months (around 04/27/2018).  [name], MD

## 2018-01-26 ENCOUNTER — Ambulatory Visit: Payer: Federal, State, Local not specified - PPO | Admitting: Internal Medicine

## 2018-01-26 DIAGNOSIS — M62838 Other muscle spasm: Secondary | ICD-10-CM | POA: Diagnosis not present

## 2018-01-26 DIAGNOSIS — K5900 Constipation, unspecified: Secondary | ICD-10-CM | POA: Diagnosis not present

## 2018-01-26 DIAGNOSIS — M6281 Muscle weakness (generalized): Secondary | ICD-10-CM | POA: Diagnosis not present

## 2018-01-26 DIAGNOSIS — N94819 Vulvodynia, unspecified: Secondary | ICD-10-CM | POA: Diagnosis not present

## 2018-02-15 ENCOUNTER — Ambulatory Visit: Payer: Federal, State, Local not specified - PPO | Admitting: Internal Medicine

## 2018-02-24 DIAGNOSIS — N900 Mild vulvar dysplasia: Secondary | ICD-10-CM | POA: Diagnosis not present

## 2018-02-24 DIAGNOSIS — N809 Endometriosis, unspecified: Secondary | ICD-10-CM | POA: Diagnosis not present

## 2018-02-24 DIAGNOSIS — N94819 Vulvodynia, unspecified: Secondary | ICD-10-CM | POA: Diagnosis not present

## 2018-02-24 DIAGNOSIS — R198 Other specified symptoms and signs involving the digestive system and abdomen: Secondary | ICD-10-CM | POA: Diagnosis not present

## 2018-03-11 DIAGNOSIS — N979 Female infertility, unspecified: Secondary | ICD-10-CM | POA: Diagnosis not present

## 2018-03-11 DIAGNOSIS — Z8742 Personal history of other diseases of the female genital tract: Secondary | ICD-10-CM | POA: Diagnosis not present

## 2018-03-11 DIAGNOSIS — N903 Dysplasia of vulva, unspecified: Secondary | ICD-10-CM | POA: Diagnosis not present

## 2018-03-11 DIAGNOSIS — N94819 Vulvodynia, unspecified: Secondary | ICD-10-CM | POA: Diagnosis not present

## 2018-03-11 DIAGNOSIS — Z8349 Family history of other endocrine, nutritional and metabolic diseases: Secondary | ICD-10-CM | POA: Diagnosis not present

## 2018-03-11 DIAGNOSIS — Z6822 Body mass index (BMI) 22.0-22.9, adult: Secondary | ICD-10-CM | POA: Diagnosis not present

## 2018-03-11 DIAGNOSIS — I10 Essential (primary) hypertension: Secondary | ICD-10-CM | POA: Diagnosis not present

## 2018-03-11 DIAGNOSIS — N809 Endometriosis, unspecified: Secondary | ICD-10-CM | POA: Diagnosis not present

## 2018-03-11 DIAGNOSIS — Z8249 Family history of ischemic heart disease and other diseases of the circulatory system: Secondary | ICD-10-CM | POA: Diagnosis not present

## 2018-03-11 DIAGNOSIS — R569 Unspecified convulsions: Secondary | ICD-10-CM | POA: Diagnosis not present

## 2018-03-11 DIAGNOSIS — Z87412 Personal history of vulvar dysplasia: Secondary | ICD-10-CM | POA: Diagnosis not present

## 2018-03-11 DIAGNOSIS — Z823 Family history of stroke: Secondary | ICD-10-CM | POA: Diagnosis not present

## 2018-03-18 ENCOUNTER — Encounter: Payer: Self-pay | Admitting: Internal Medicine

## 2018-03-18 ENCOUNTER — Ambulatory Visit: Payer: Federal, State, Local not specified - PPO | Admitting: Internal Medicine

## 2018-03-18 VITALS — BP 142/94 | HR 90 | Temp 98.2°F | Resp 16 | Ht 64.0 in | Wt 128.5 lb

## 2018-03-18 DIAGNOSIS — M659 Synovitis and tenosynovitis, unspecified: Secondary | ICD-10-CM | POA: Insufficient documentation

## 2018-03-18 DIAGNOSIS — Z23 Encounter for immunization: Secondary | ICD-10-CM

## 2018-03-18 DIAGNOSIS — I10 Essential (primary) hypertension: Secondary | ICD-10-CM

## 2018-03-18 DIAGNOSIS — M65931 Unspecified synovitis and tenosynovitis, right forearm: Secondary | ICD-10-CM

## 2018-03-18 IMAGING — MR MR PELVIS WO/W CM
11 of 16 series · 23 of 48 positions shown · IV contrast (12 MH)
Comparison: Ultrasound 01/30/2017

CLINICAL DATA: Fibroids.  Right ovary cyst.

EXAM:
MRI PELVIS WITHOUT AND WITH CONTRAST
TECHNIQUE: Multiplanar multisequence MR imaging of the pelvis was performed
both before and after administration of intravenous contrast.
CONTRAST:  12 cc of MultiHance

[Series 6: cor ssfse · coronal · 5.0mm · 0.78mm/px · 1 of 30 slices shown]
[im 1/30]
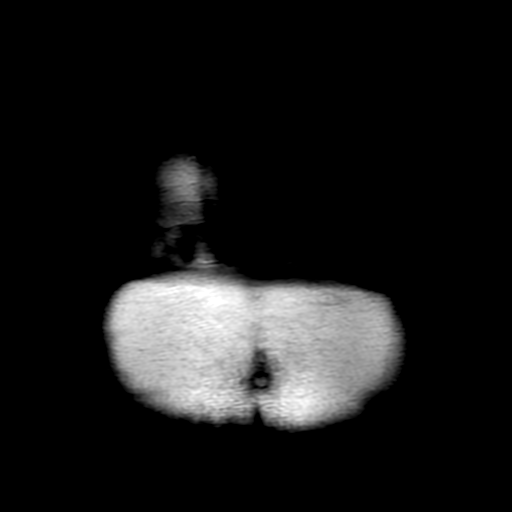

[Series 8: T2 · axial · 5.0mm · 0.47mm/px · 1 of 34 slices shown (1 of 4)]
[im 1/34]
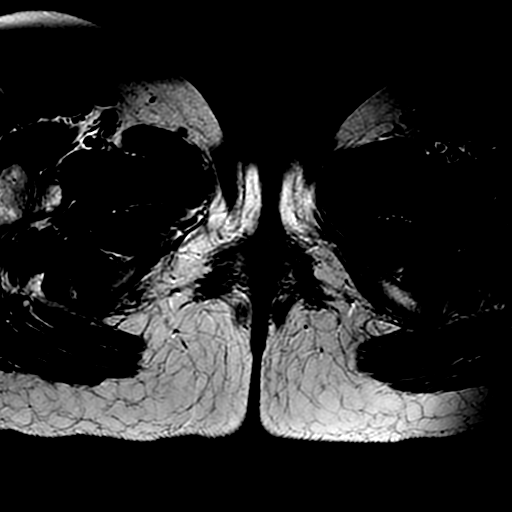

[Series 9: T2 · axial · 5.0mm · 0.94mm/px · z∈[-189,+9]mm · 2 of 34 slices shown (2 of 4)]
[im 1/34]
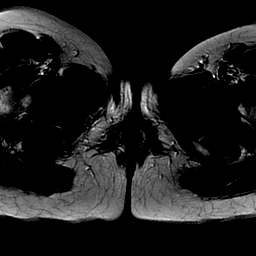
[im 34/34]
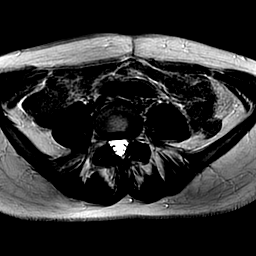

[Series 10: T2 fat-sat · axial · 5.0mm · 0.94mm/px · z∈[-189,+9]mm · 2 of 34 slices shown]
[im 1/34]
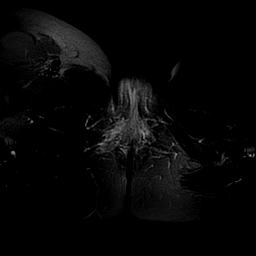
[im 34/34]
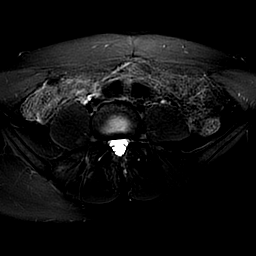

[Series 11: T1 fat-sat · axial · 5.0mm · 0.47mm/px · z∈[-189,+9]mm · 2 of 34 slices shown (1 of 3)]
[im 1/34]
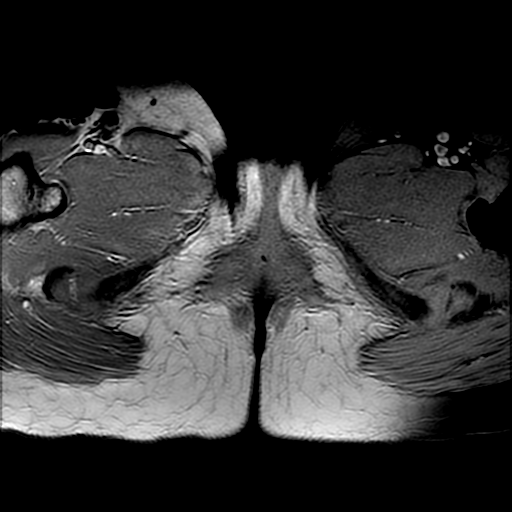
[im 34/34]
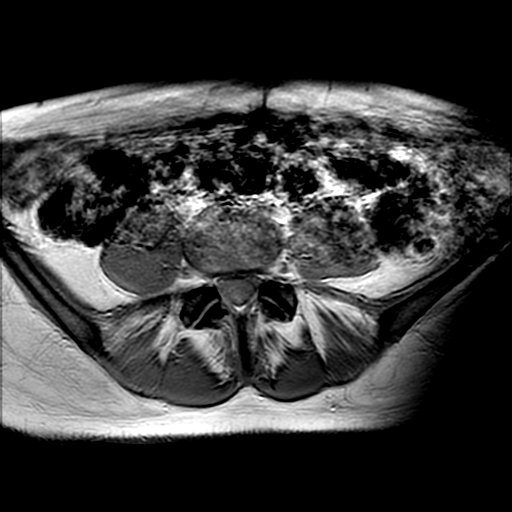

[Series 13: T2 · sagittal · 5.0mm · 0.94mm/px · 1 of 32 slices shown (3 of 4)]
[im 1/32]
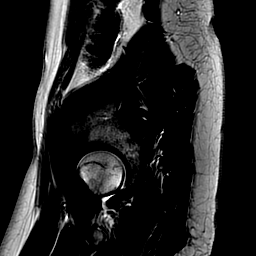

[Series 14: T1 fat-sat · axial · non-contrast · 5.0mm · 0.47mm/px · z∈[-189,+9]mm · 2 of 34 slices shown (2 of 3)]
[im 1/34]
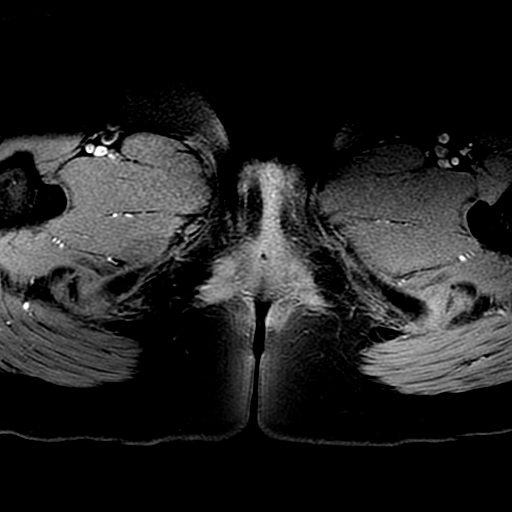
[im 34/34]
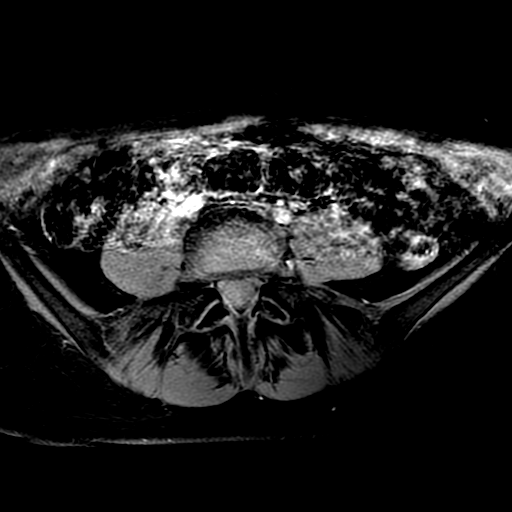

[Series 16: T1 fat-sat · coronal · 5.0mm · 0.47mm/px · 1 of 29 slices shown (3 of 3)]
[im 1/29]
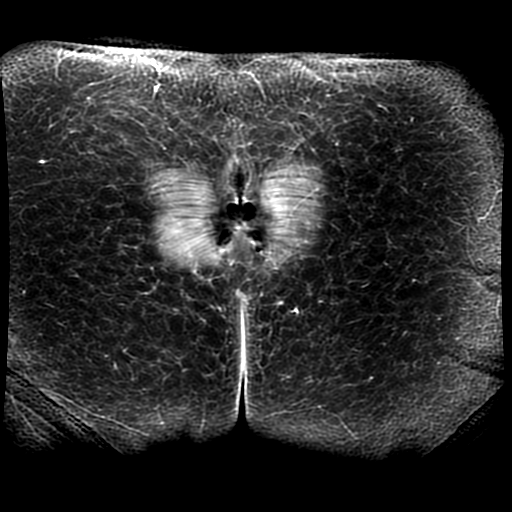

[Series 17: T2 · sagittal · 5.0mm · 0.94mm/px · 1 of 32 slices shown (4 of 4)]
[im 1/32]
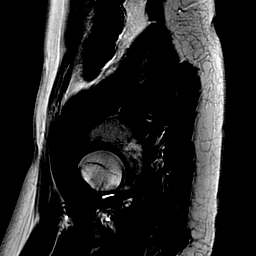

[Series 1500: T1 dynamic post-contrast · axial · non-contrast · 4.0mm · 0.74mm/px · z∈[-202,+12]mm · 5 of 108 slices shown (1 of 2)]
[im 1/108]
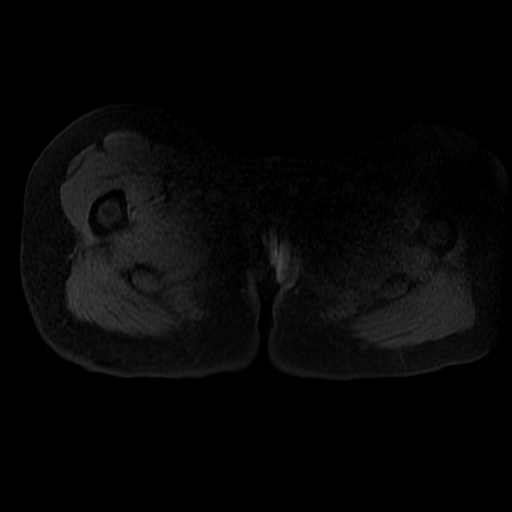
[im 27/108]
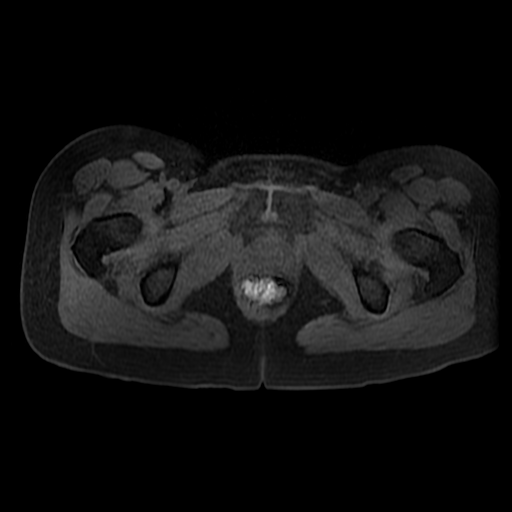
[im 54/108]
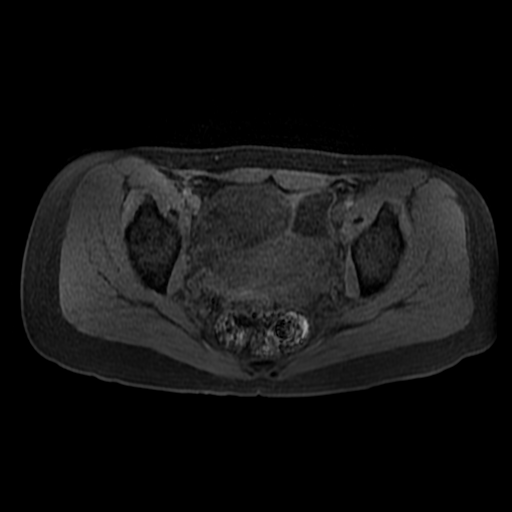
[im 81/108]
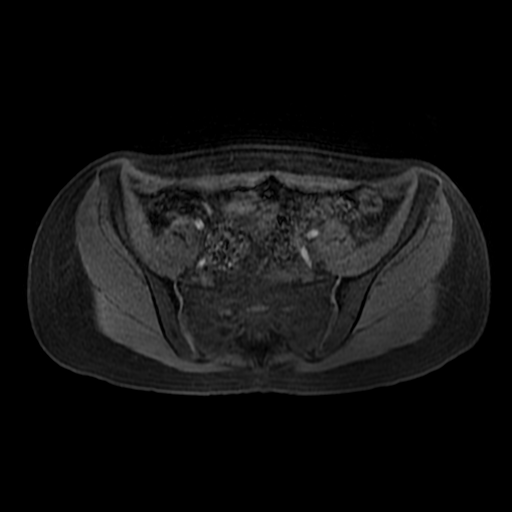
[im 108/108]
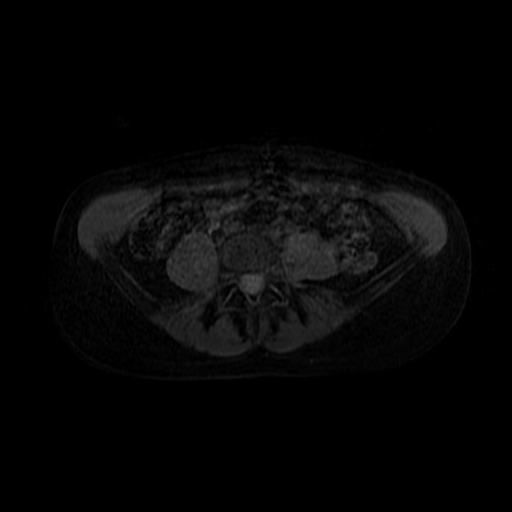

[Series 1501: T1 dynamic post-contrast · axial · non-contrast · 4.0mm · 0.74mm/px · z∈[-202,+12]mm · 5 of 108 slices shown (2 of 2)]
[im 1/108]
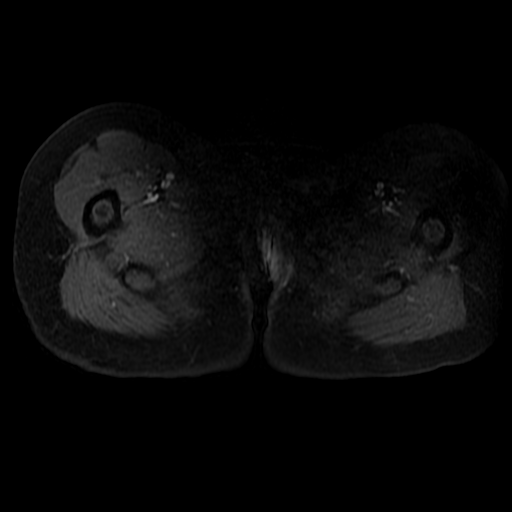
[im 27/108]
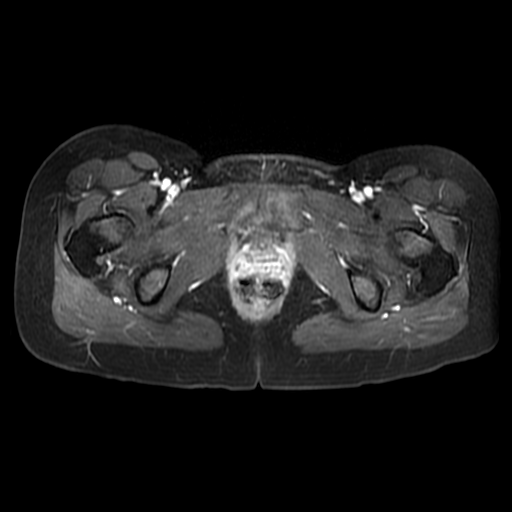
[im 54/108]
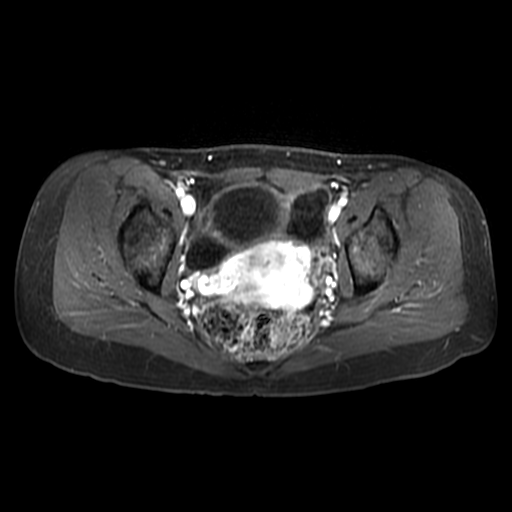
[im 81/108]
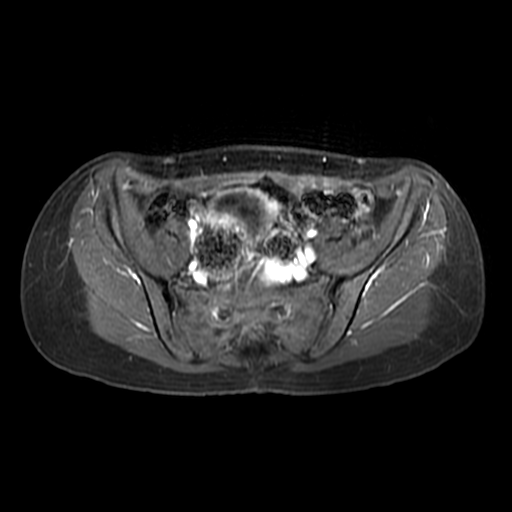
[im 108/108]
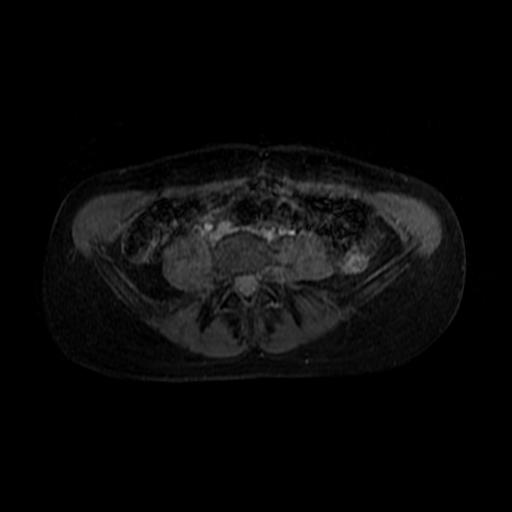

[23 of 48 positions shown; findings below may reference images not displayed]

FINDINGS: Urinary Tract:  No abnormality visualized.

Bowel: Moderate stool burden identified within the distal colon and
rectum.

Vascular/Lymphatic: No pathologically enlarged lymph nodes. No
significant vascular abnormality seen.

Reproductive: The uterus retroflexed. Measures 7.5 cm in length,
cm in AP dimension and 6.4 cm transverse. There is no uterine mass
identified. Normal trilaminar appearance of the endometrium. No
endometrial focal lesion identified. Normal appearance of the left
ovary measuring 3.3 x 1.7 x 2.5 cm. There is a large unilocular cyst
arising from the right ovary which measures 6.9 cm in maximum
dimension. This is uniformly T2 hyperintense and T1 hypointense.
Following IV contrast administration there is no internal areas of
nodularity or septation.

Other:  Trace free fluid noted within the pelvis.

Musculoskeletal: No suspicious bone lesions identified.
IMPRESSION: 1. 6.7 cm simple appearing cyst arises from the right ovary. This is
almost certainly benign, but follow up ultrasound is recommended in
1 year according to the Society of Radiologists in MltrasoundBR1R
Consensus Conference Statement (Quirijn Amazigh et al. Management of
Asymptomatic Ovarian and Other Adnexal Cysts Imaged at US: Society
of Radiologists in Ultrasound Consensus Conference Statement 8696.
Radiology [DATE]): [id number].).
2. No uterine fibroids identified.

## 2018-03-18 MED ORDER — NEBIVOLOL HCL 5 MG PO TABS: 5.0000 mg | ORAL_TABLET | Freq: Every day | ORAL | 1 refills | Status: DC

## 2018-03-18 MED ORDER — CHLORTHALIDONE 25 MG PO TABS: 25.0000 mg | ORAL_TABLET | Freq: Every day | ORAL | 0 refills | Status: DC

## 2018-03-18 MED ORDER — DICLOFENAC 18 MG PO CAPS: 1.0000 | ORAL_CAPSULE | Freq: Three times a day (TID) | ORAL | 1 refills | Status: DC

## 2018-03-18 NOTE — Patient Instructions (Signed)
Flexor Carpi Ulnaris and [employer]onic Radialis Tendinitis What are the causes? These conditions may be caused by:  Repetitive motions or overuse (common).  Wear and tear. (common).  An injury.  Excessive exercise or strain.  Certain antibiotic medicines.  In some cases, the cause may not be known. What increases the risk? These conditions are more likely to develop in:  People who play sports that involve constantly flexing or stretching the wrist and forearm, such as volleyball and water polo.  Older adults.  People with have a job that involves flexing the wrist over and over, such as people who work as Chiropractor, Psychologist, prison and probation services, Personnel officer.  People with certain health conditions, such as: ? Rheumatoid arthritis. ? Gout. ? Diabetes.  What are the signs or symptoms? Symptoms of these conditions may develop gradually. Symptoms include:  Pain or tenderness in the wrist.  Pain when flexing or stretching the wrist.  Pain when gripping or lifting with the palm of the hand.  Swelling.  How is this diagnosed? This condition may be diagnosed based on:  Your symptoms.  Your medical history.  A physical exam.  During the physical exam, you may be asked to move your hand, wrist, and arm in certain ways. In order to rule out another condition, your health care provider may order one or more of the following tests:  MRI to get detailed images of the body's soft tissues and detect tendon tears and inflammation.  Ultrasound to detect soft-tissue injuries, such as tears and inflammation of the ligaments or tendons.  How is this treated? Treatment for this condition may include:  Rest. You should limit activities that cause your symptoms to get worse or flare up.  Heat and ice treatment. Both heat and cold can help to ease pain and may be applied to the wrist or forearm as needed to reduce pain and inflammation.  Splint. You may need to wear a splint to keep your wrist and  forearm from moving (keep them immobilized) until your symptoms improve.  Medicine. Your health care provider may prescribe steroids or other anti-inflammatory medicines, like ibuprofen, to temporarily ease your pain and other symptoms.  Physical therapy. Your health care provider may ask you to do exercises to maintain mobility and range of motion in your wrist.  Follow these instructions at home: If you have a splint:  Wear it as told by your health care provider. Remove it only as told by your health care provider.  Loosen the splint if your fingers tingle, become numb, or turn cold and blue.  Do not let your splint get wet if it is not waterproof.  Keep the splint clean. Managing pain, stiffness, and swelling  If directed, apply ice to the injured area. ? Put ice in a plastic bag. ? Place a damp towel between your skin and the bag. ? Leave the ice on for 20 minutes, 2-3 times a day.  Move your fingers often to avoid stiffness and to lessen swelling.  Raise (elevate) the injured area above the level of your heart while you are sitting or lying down. Activity  Return to your normal activities as told by your health care provider. Ask your health care provider what activities are safe for you.  Do exercises as told by your health care provider. General instructions  Do not use any tobacco products, including cigarettes, chewing tobacco, or e-cigarettes. Tobacco can delay healing. If you need help quitting, ask your health care provider.  Take over-the-counter  and prescription medicines only as told by your health care provider.  Keep all follow-up visits as told by your health care provider. This is important. How is this prevented?  Warm up and stretch before being active.  Cool down and stretch after being active.  Give your body time to rest between periods of activity.  Make sure to use equipment that fits you.  Be safe and responsible while being active to avoid  falls.  Do at least 150 minutes of moderate-intensity exercise each week, such as brisk walking or water aerobics.  Maintain physical fitness, including: ? Strength. ? Flexibility. ? Cardiovascular fitness. ? Endurance. Contact a health care provider if:  Your pain does not improve.  Your pain gets worse. Get help right away if:  Your pain is severe.  You cannot move your wrist. This information is not intended to replace advice given to you by your health care provider. Make sure you discuss any questions you have with your health care provider. Document Released: 06/16/2005 Document Revised: 02/19/2016 Document Reviewed: 02/23/2015 Elsevier Interactive Patient Education  2017 [employer].

## 2018-03-18 NOTE — Progress Notes (Signed)
Subjective:  Patient ID: Michele Mills, female    DOB: Nov 05, 1975  Age: 42 y.o. MRN: [id number]  CC: Hypertension   HPI Michele Mills presents for f/up - She is concerned that her blood pressure is not adequately well controlled.  She tells me she saw her gynecologist about 2 weeks ago and her blood pressure was elevated at 169/97 while she was taking hydrochlorothiazide.  She has since added Bystolic 5 mg a day and says the blood pressure is better but is still not well controlled.  She denies headache, blurred vision, chest pain, shortness of breath, or edema.  She also complains of a 2-week history of pain and swelling on her wrist.  She says the symptoms started after she held her new cell phone in her hand for an extended period of time.  She says the symptoms are gradually improving.  She has not treated this.  Outpatient Medications Prior to Visit  Medication Sig Dispense Refill  . clobetasol ointment (TEMOVATE) 0.05 % Apply topically.    . fluconazole (DIFLUCAN) 150 MG tablet Take 150 mg by mouth once a week.    . hydrochlorothiazide (MICROZIDE) 12.5 MG capsule Take 1 capsule (12.5 mg total) by mouth daily. 90 capsule 0  . nebivolol (BYSTOLIC) 5 MG tablet Take 5 mg by mouth daily.     No facility-administered medications prior to visit.     ROS Review of Systems  Constitutional: Negative for appetite change, diaphoresis and fatigue.  HENT: Negative.   Eyes: Negative for visual disturbance.  Respiratory: Negative for cough, chest tightness, shortness of breath and wheezing.   Cardiovascular: Negative for chest pain, palpitations and leg swelling.  Gastrointestinal: Negative for abdominal pain, constipation, diarrhea, nausea and vomiting.  Endocrine: Negative.   Genitourinary: Negative.  Negative for difficulty urinating.  Musculoskeletal: Positive for arthralgias. Negative for neck pain.  Skin: Negative.   Neurological: Negative for dizziness, weakness and light-headedness.    Hematological: Negative for adenopathy. Does not bruise/bleed easily.  Psychiatric/Behavioral: Negative.     Objective:  BP (!) 142/94 (BP Location: Left Arm, Patient Position: Sitting, Cuff Size: Normal)   Pulse 90   Temp 98.2 F (36.8 C) (Oral)   Resp 16   Ht 5\' 4"  (1.626 m)   Wt 128 lb 8 oz (58.3 kg)   LMP 03/14/2018   SpO2 99%   BMI 22.06 kg/m   BP Readings from Last 3 Encounters:  03/18/18 (!) 142/94  01/25/18 124/88  12/15/17 130/82    Wt Readings from Last 3 Encounters:  03/18/18 128 lb 8 oz (58.3 kg)  01/25/18 131 lb 8 oz (59.6 kg)  12/15/17 133 lb (60.3 kg)    Physical Exam  Constitutional: She is oriented to person, place, and time. No distress.  HENT:  Mouth/Throat: Oropharynx is clear and moist. No oropharyngeal exudate.  Eyes: Conjunctivae are normal. No scleral icterus.  Neck: Normal range of motion. Neck supple. No JVD present. No thyromegaly present.  Cardiovascular: Normal rate, regular rhythm and normal heart sounds. Exam reveals no gallop.  No murmur heard. Pulmonary/Chest: Effort normal and breath sounds normal. She has no wheezes. She has no rales.  Abdominal: Soft. Normal appearance and bowel sounds are normal. She exhibits no mass. There is no hepatosplenomegaly. There is no tenderness.  Musculoskeletal: Normal range of motion. She exhibits no edema or deformity.       Right wrist: She exhibits tenderness and swelling. She exhibits normal range of motion, no bony tenderness, no crepitus and  no deformity.  On the dorsal lateral surface of the right wrist there is a faint area of swelling and tenderness over the tendon.  There is no erythema or crepitance.  The wrist does show free range of motion.  Lymphadenopathy:    She has no cervical adenopathy.  Neurological: She is alert and oriented to person, place, and time.  Skin: Skin is warm and dry. She is not diaphoretic. No pallor.  Vitals reviewed.   Lab Results  Component Value Date   WBC 8.9  08/10/2017   HGB 13.6 08/10/2017   HCT 40 08/10/2017   PLT 334 08/10/2017   GLUCOSE 93 11/03/2017   CHOL 178 08/10/2017   TRIG 131 08/10/2017   HDL 63 08/10/2017   LDLCALC 89 08/10/2017   ALT 13 08/10/2017   AST 12 (A) 08/10/2017   NA 139 11/03/2017   K 3.7 11/03/2017   CL 102 11/03/2017   CREATININE 0.77 11/03/2017   BUN 9 11/03/2017   CO2 28 11/03/2017   TSH 3.29 11/03/2017    US Pelvic Complete With Transvaginal  Result Date: 05/13/2017 CLINICAL DATA:  Follow-up ovarian cyst, endometriosis EXAM: TRANSABDOMINAL AND TRANSVAGINAL ULTRASOUND OF PELVIS TECHNIQUE: Both transabdominal and transvaginal ultrasound examinations of the pelvis were performed. Transabdominal technique was performed for global imaging of the pelvis including uterus, ovaries, adnexal regions, and pelvic cul-de-sac. It was necessary to proceed with endovaginal exam following the transabdominal exam to visualize the endometrium and bilateral ovaries. COMPARISON:  MR pelvis dated 03/09/2017. Pelvic ultrasound dated 01/30/2017. FINDINGS: Uterus Measurements: 8.9 x 5.9 x 6.5 cm. No fibroids or other mass visualized. Endometrium Thickness: 9 mm.  No focal abnormality visualized. Right ovary Measurements: 4.1 x 1.7 x 1.9 cm. Prior dominant 6.7 cm cyst/follicle has resolved. Currently, a dominant 1.6 cm follicle is present, physiologic Left ovary Measurements: 4.9 x 2.4 x 2.5 cm. Dominant 3.1 x 2.2 x 2.5 cm cyst/follicle, likely physiologic. Other findings No abnormal free fluid. IMPRESSION: Prior dominant 6.7 cm right ovarian cyst/follicle has resolved. Dominant 3.1 cm left ovarian cyst/follicle on the current study is likely physiologic. Negative pelvic ultrasound. Electronically Signed   By: Julian Hy M.D.   On: 05/13/2017 14:52    Assessment & Plan:   Michele Mills was seen today for hypertension.  Diagnoses and all orders for this visit:  Need for influenza vaccination -     Flu Vaccine QUAD 36+ mos  IM  Tenosynovitis of right wrist- I have asked her to rest and ice the area.  I have asked her to try an anti-inflammatory for symptom relief.  I gave her patient education information about treating wrist tendinitis.  She will let me know if this does not resolve soon. -     Diclofenac (ZORVOLEX) 18 MG CAPS; Take 1 capsule by mouth 3 (three) times daily with meals.  Essential hypertension- Her blood pressure is not adequately well controlled.  I will upgrade her to a more potent thiazide diuretic.  Will continue nebivolol at the current dose. -     nebivolol (BYSTOLIC) 5 MG tablet; Take 1 tablet (5 mg total) by mouth daily. -     Discontinue: chlorthalidone (HYGROTON) 25 MG tablet; Take 1 tablet (25 mg total) by mouth daily. -     chlorthalidone (HYGROTON) 25 MG tablet; Take 1 tablet (25 mg total) by mouth daily.   I have discontinued Michele Mills's fluconazole and hydrochlorothiazide. I have also changed her nebivolol. Additionally, I am having her start on Diclofenac. Lastly,  I am having her maintain her clobetasol ointment and chlorthalidone.  Meds ordered this encounter  Medications  . nebivolol (BYSTOLIC) 5 MG tablet    Sig: Take 1 tablet (5 mg total) by mouth daily.    Dispense:  90 tablet    Refill:  1  . DISCONTD: chlorthalidone (HYGROTON) 25 MG tablet    Sig: Take 1 tablet (25 mg total) by mouth daily.    Dispense:  90 tablet    Refill:  0  . Diclofenac (ZORVOLEX) 18 MG CAPS    Sig: Take 1 capsule by mouth 3 (three) times daily with meals.    Dispense:  90 capsule    Refill:  1  . chlorthalidone (HYGROTON) 25 MG tablet    Sig: Take 1 tablet (25 mg total) by mouth daily.    Dispense:  90 tablet    Refill:  0     Follow-up: Return in about 3 months (around 06/17/2018).  [name], MD

## 2018-03-30 DIAGNOSIS — K5900 Constipation, unspecified: Secondary | ICD-10-CM | POA: Diagnosis not present

## 2018-03-30 DIAGNOSIS — N94819 Vulvodynia, unspecified: Secondary | ICD-10-CM | POA: Diagnosis not present

## 2018-03-30 DIAGNOSIS — M62838 Other muscle spasm: Secondary | ICD-10-CM | POA: Diagnosis not present

## 2018-03-30 DIAGNOSIS — M6281 Muscle weakness (generalized): Secondary | ICD-10-CM | POA: Diagnosis not present

## 2018-04-09 ENCOUNTER — Encounter: Payer: Self-pay | Admitting: Internal Medicine

## 2018-04-12 DIAGNOSIS — M47812 Spondylosis without myelopathy or radiculopathy, cervical region: Secondary | ICD-10-CM | POA: Diagnosis not present

## 2018-04-12 DIAGNOSIS — M50322 Other cervical disc degeneration at C5-C6 level: Secondary | ICD-10-CM | POA: Diagnosis not present

## 2018-04-12 DIAGNOSIS — M50323 Other cervical disc degeneration at C6-C7 level: Secondary | ICD-10-CM | POA: Diagnosis not present

## 2018-04-12 DIAGNOSIS — M9901 Segmental and somatic dysfunction of cervical region: Secondary | ICD-10-CM | POA: Diagnosis not present

## 2018-04-13 DIAGNOSIS — D225 Melanocytic nevi of trunk: Secondary | ICD-10-CM | POA: Diagnosis not present

## 2018-04-13 DIAGNOSIS — Z1283 Encounter for screening for malignant neoplasm of skin: Secondary | ICD-10-CM | POA: Diagnosis not present

## 2018-04-15 DIAGNOSIS — M9901 Segmental and somatic dysfunction of cervical region: Secondary | ICD-10-CM | POA: Diagnosis not present

## 2018-04-15 DIAGNOSIS — M47812 Spondylosis without myelopathy or radiculopathy, cervical region: Secondary | ICD-10-CM | POA: Diagnosis not present

## 2018-04-15 DIAGNOSIS — M50323 Other cervical disc degeneration at C6-C7 level: Secondary | ICD-10-CM | POA: Diagnosis not present

## 2018-04-15 DIAGNOSIS — M50322 Other cervical disc degeneration at C5-C6 level: Secondary | ICD-10-CM | POA: Diagnosis not present

## 2018-04-22 DIAGNOSIS — M47812 Spondylosis without myelopathy or radiculopathy, cervical region: Secondary | ICD-10-CM | POA: Diagnosis not present

## 2018-04-22 DIAGNOSIS — M50323 Other cervical disc degeneration at C6-C7 level: Secondary | ICD-10-CM | POA: Diagnosis not present

## 2018-04-22 DIAGNOSIS — M9901 Segmental and somatic dysfunction of cervical region: Secondary | ICD-10-CM | POA: Diagnosis not present

## 2018-04-22 DIAGNOSIS — M50322 Other cervical disc degeneration at C5-C6 level: Secondary | ICD-10-CM | POA: Diagnosis not present

## 2018-04-26 ENCOUNTER — Ambulatory Visit: Payer: Federal, State, Local not specified - PPO | Admitting: Internal Medicine

## 2018-05-03 ENCOUNTER — Other Ambulatory Visit: Payer: Self-pay | Admitting: Internal Medicine

## 2018-05-03 DIAGNOSIS — M659 Synovitis and tenosynovitis, unspecified: Secondary | ICD-10-CM

## 2018-05-06 DIAGNOSIS — M50323 Other cervical disc degeneration at C6-C7 level: Secondary | ICD-10-CM | POA: Diagnosis not present

## 2018-05-06 DIAGNOSIS — M9903 Segmental and somatic dysfunction of lumbar region: Secondary | ICD-10-CM | POA: Diagnosis not present

## 2018-05-06 DIAGNOSIS — M50322 Other cervical disc degeneration at C5-C6 level: Secondary | ICD-10-CM | POA: Diagnosis not present

## 2018-05-06 DIAGNOSIS — M47812 Spondylosis without myelopathy or radiculopathy, cervical region: Secondary | ICD-10-CM | POA: Diagnosis not present

## 2018-05-11 DIAGNOSIS — K080 Exfoliation of teeth due to systemic causes: Secondary | ICD-10-CM | POA: Diagnosis not present

## 2018-05-13 DIAGNOSIS — M50323 Other cervical disc degeneration at C6-C7 level: Secondary | ICD-10-CM | POA: Diagnosis not present

## 2018-05-13 DIAGNOSIS — M9903 Segmental and somatic dysfunction of lumbar region: Secondary | ICD-10-CM | POA: Diagnosis not present

## 2018-05-13 DIAGNOSIS — M47812 Spondylosis without myelopathy or radiculopathy, cervical region: Secondary | ICD-10-CM | POA: Diagnosis not present

## 2018-05-13 DIAGNOSIS — M50322 Other cervical disc degeneration at C5-C6 level: Secondary | ICD-10-CM | POA: Diagnosis not present

## 2018-05-24 DIAGNOSIS — M9903 Segmental and somatic dysfunction of lumbar region: Secondary | ICD-10-CM | POA: Diagnosis not present

## 2018-05-24 DIAGNOSIS — M50322 Other cervical disc degeneration at C5-C6 level: Secondary | ICD-10-CM | POA: Diagnosis not present

## 2018-05-24 DIAGNOSIS — M47812 Spondylosis without myelopathy or radiculopathy, cervical region: Secondary | ICD-10-CM | POA: Diagnosis not present

## 2018-05-24 DIAGNOSIS — M50323 Other cervical disc degeneration at C6-C7 level: Secondary | ICD-10-CM | POA: Diagnosis not present

## 2018-06-01 DIAGNOSIS — K5900 Constipation, unspecified: Secondary | ICD-10-CM | POA: Diagnosis not present

## 2018-06-01 DIAGNOSIS — N94819 Vulvodynia, unspecified: Secondary | ICD-10-CM | POA: Diagnosis not present

## 2018-06-01 DIAGNOSIS — M62838 Other muscle spasm: Secondary | ICD-10-CM | POA: Diagnosis not present

## 2018-06-01 DIAGNOSIS — M6281 Muscle weakness (generalized): Secondary | ICD-10-CM | POA: Diagnosis not present

## 2018-06-03 DIAGNOSIS — M47812 Spondylosis without myelopathy or radiculopathy, cervical region: Secondary | ICD-10-CM | POA: Diagnosis not present

## 2018-06-03 DIAGNOSIS — M50323 Other cervical disc degeneration at C6-C7 level: Secondary | ICD-10-CM | POA: Diagnosis not present

## 2018-06-03 DIAGNOSIS — M9903 Segmental and somatic dysfunction of lumbar region: Secondary | ICD-10-CM | POA: Diagnosis not present

## 2018-06-03 DIAGNOSIS — M50322 Other cervical disc degeneration at C5-C6 level: Secondary | ICD-10-CM | POA: Diagnosis not present

## 2018-06-14 ENCOUNTER — Encounter: Payer: Self-pay | Admitting: Internal Medicine

## 2018-06-14 ENCOUNTER — Other Ambulatory Visit (INDEPENDENT_AMBULATORY_CARE_PROVIDER_SITE_OTHER): Payer: Federal, State, Local not specified - PPO

## 2018-06-14 ENCOUNTER — Ambulatory Visit: Payer: Federal, State, Local not specified - PPO | Admitting: Internal Medicine

## 2018-06-14 VITALS — BP 140/90 | HR 70 | Temp 98.7°F | Resp 16 | Ht 64.0 in | Wt 136.0 lb

## 2018-06-14 DIAGNOSIS — I10 Essential (primary) hypertension: Secondary | ICD-10-CM | POA: Diagnosis not present

## 2018-06-14 LAB — BASIC METABOLIC PANEL
BUN: 13 mg/dL (ref 6–23)
CALCIUM: 9.3 mg/dL (ref 8.4–10.5)
CO2: 29 mEq/L (ref 19–32)
Chloride: 103 mEq/L (ref 96–112)
Creatinine, Ser: 0.69 mg/dL (ref 0.40–1.20)
GFR: 99.48 mL/min (ref >60.00)
Glucose, Bld: 78 mg/dL (ref 70–99)
POTASSIUM: 3.6 meq/L (ref 3.5–5.1)
Sodium: 139 mEq/L (ref 135–145)

## 2018-06-14 MED ORDER — NEBIVOLOL HCL 5 MG PO TABS: 5.0000 mg | ORAL_TABLET | Freq: Every day | ORAL | 1 refills | Status: DC

## 2018-06-14 MED ORDER — HYDROCHLOROTHIAZIDE 12.5 MG PO CAPS: 12.5000 mg | ORAL_CAPSULE | Freq: Every day | ORAL | 1 refills | Status: DC

## 2018-06-14 NOTE — Patient Instructions (Signed)

## 2018-06-14 NOTE — Progress Notes (Signed)
Subjective:  Patient ID: Michele Mills, female    DOB: 10/23/1975  Age: 42 y.o. MRN: [id number]  CC: Hypertension   HPI Michele Mills presents for a BP check - She tells me that her blood pressure has been well controlled.  She felt like chlorthalidone caused headaches so she stopped taking it and has been controlling her blood pressure with HCTZ and Bystolic.  She denies headache, palpitations, DOE, CP, edema, or fatigue.  Outpatient Medications Prior to Visit  Medication Sig Dispense Refill  . clobetasol ointment (TEMOVATE) 0.05 % Apply topically.    Marland Kitchen ZORVOLEX 18 MG CAPS TAKE ONE CAPSULE BY MOUTH THREE TIMES DAILY with meals 90 capsule 1  . hydrochlorothiazide (MICROZIDE) 12.5 MG capsule Take 12.5 mg by mouth daily.    . nebivolol (BYSTOLIC) 5 MG tablet Take 1 tablet (5 mg total) by mouth daily. 90 tablet 1  . chlorthalidone (HYGROTON) 25 MG tablet Take 1 tablet (25 mg total) by mouth daily. (Patient not taking: Reported on 06/14/2018) 90 tablet 0   No facility-administered medications prior to visit.     ROS Review of Systems  Constitutional: Negative for diaphoresis and fatigue.  HENT: Negative.   Eyes: Negative for visual disturbance.  Respiratory: Negative for cough, chest tightness, shortness of breath and wheezing.   Cardiovascular: Negative for chest pain and palpitations.  Gastrointestinal: Negative for abdominal pain, diarrhea and nausea.  Genitourinary: Negative.  Negative for difficulty urinating and dysuria.  Musculoskeletal: Negative.  Negative for arthralgias and myalgias.  Skin: Negative.  Negative for color change.  Neurological: Negative.  Negative for dizziness, weakness, light-headedness, numbness and headaches.  Hematological: Negative for adenopathy. Does not bruise/bleed easily.  Psychiatric/Behavioral: Negative.     Objective:  BP 140/90 (BP Location: Left Arm, Patient Position: Sitting, Cuff Size: Normal)   Pulse 70   Temp 98.7 F (37.1 C) (Oral)    Resp 16   Ht 5\' 4"  (1.626 m)   Wt 136 lb (61.7 kg)   LMP 06/13/2018 Comment: endometriosis  SpO2 99%   BMI 23.34 kg/m   BP Readings from Last 3 Encounters:  06/14/18 140/90  03/18/18 (!) 142/94  01/25/18 124/88    Wt Readings from Last 3 Encounters:  06/14/18 136 lb (61.7 kg)  03/18/18 128 lb 8 oz (58.3 kg)  01/25/18 131 lb 8 oz (59.6 kg)    Physical Exam Vitals signs reviewed.  HENT:     Nose: Nose normal.     Mouth/Throat:     Mouth: Mucous membranes are moist.     Pharynx: No posterior oropharyngeal erythema.  Eyes:     Conjunctiva/sclera: Conjunctivae normal.  Neck:     Musculoskeletal: Normal range of motion and neck supple. No muscular tenderness.  Cardiovascular:     Rate and Rhythm: Normal rate and regular rhythm.     Pulses: Normal pulses.     Heart sounds: No murmur. No gallop.   Pulmonary:     Effort: Pulmonary effort is normal.     Breath sounds: Normal breath sounds. No wheezing, rhonchi or rales.  Abdominal:     General: Bowel sounds are normal. There is no distension.     Palpations: There is no hepatomegaly or splenomegaly.     Tenderness: There is no abdominal tenderness.  Musculoskeletal: Normal range of motion.        General: No swelling or tenderness.  Skin:    General: Skin is warm and dry.     Coloration: Skin is not pale.  Neurological:     General: No focal deficit present.     Mental Status: She is alert and oriented to person, place, and time. Mental status is at baseline.     Lab Results  Component Value Date   WBC 8.9 08/10/2017   HGB 13.6 08/10/2017   HCT 40 08/10/2017   PLT 334 08/10/2017   GLUCOSE 78 06/14/2018   CHOL 178 08/10/2017   TRIG 131 08/10/2017   HDL 63 08/10/2017   LDLCALC 89 08/10/2017   ALT 13 08/10/2017   AST 12 (A) 08/10/2017   NA 139 06/14/2018   K 3.6 06/14/2018   CL 103 06/14/2018   CREATININE 0.69 06/14/2018   BUN 13 06/14/2018   CO2 29 06/14/2018   TSH 3.29 11/03/2017    US Pelvic Complete  With Transvaginal  Result Date: 05/13/2017 CLINICAL DATA:  Follow-up ovarian cyst, endometriosis EXAM: TRANSABDOMINAL AND TRANSVAGINAL ULTRASOUND OF PELVIS TECHNIQUE: Both transabdominal and transvaginal ultrasound examinations of the pelvis were performed. Transabdominal technique was performed for global imaging of the pelvis including uterus, ovaries, adnexal regions, and pelvic cul-de-sac. It was necessary to proceed with endovaginal exam following the transabdominal exam to visualize the endometrium and bilateral ovaries. COMPARISON:  MR pelvis dated 03/09/2017. Pelvic ultrasound dated 01/30/2017. FINDINGS: Uterus Measurements: 8.9 x 5.9 x 6.5 cm. No fibroids or other mass visualized. Endometrium Thickness: 9 mm.  No focal abnormality visualized. Right ovary Measurements: 4.1 x 1.7 x 1.9 cm. Prior dominant 6.7 cm cyst/follicle has resolved. Currently, a dominant 1.6 cm follicle is present, physiologic Left ovary Measurements: 4.9 x 2.4 x 2.5 cm. Dominant 3.1 x 2.2 x 2.5 cm cyst/follicle, likely physiologic. Other findings No abnormal free fluid. IMPRESSION: Prior dominant 6.7 cm right ovarian cyst/follicle has resolved. Dominant 3.1 cm left ovarian cyst/follicle on the current study is likely physiologic. Negative pelvic ultrasound. Electronically Signed   By: Julian Hy M.D.   On: 05/13/2017 14:52    Assessment & Plan:   Michele Mills was seen today for hypertension.  Diagnoses and all orders for this visit:  Essential hypertension- Her blood pressure is well controlled on the current combination.  Her electrolytes and renal function are normal.  Will continue hydrochlorothiazide and nebivolol at the current doses. -     Basic metabolic panel; Future -     hydrochlorothiazide (MICROZIDE) 12.5 MG capsule; Take 1 capsule (12.5 mg total) by mouth daily. -     nebivolol (BYSTOLIC) 5 MG tablet; Take 1 tablet (5 mg total) by mouth daily.   I have discontinued Michele Mills's chlorthalidone. I have  also changed her hydrochlorothiazide. Additionally, I am having her maintain her clobetasol ointment, ZORVOLEX, and nebivolol.  Meds ordered this encounter  Medications  . hydrochlorothiazide (MICROZIDE) 12.5 MG capsule    Sig: Take 1 capsule (12.5 mg total) by mouth daily.    Dispense:  90 capsule    Refill:  1  . nebivolol (BYSTOLIC) 5 MG tablet    Sig: Take 1 tablet (5 mg total) by mouth daily.    Dispense:  90 tablet    Refill:  1     Follow-up: Return in about 6 months (around 12/14/2018).  [name], MD

## 2018-06-17 DIAGNOSIS — M50323 Other cervical disc degeneration at C6-C7 level: Secondary | ICD-10-CM | POA: Diagnosis not present

## 2018-06-17 DIAGNOSIS — M50322 Other cervical disc degeneration at C5-C6 level: Secondary | ICD-10-CM | POA: Diagnosis not present

## 2018-06-17 DIAGNOSIS — M9903 Segmental and somatic dysfunction of lumbar region: Secondary | ICD-10-CM | POA: Diagnosis not present

## 2018-06-17 DIAGNOSIS — M47812 Spondylosis without myelopathy or radiculopathy, cervical region: Secondary | ICD-10-CM | POA: Diagnosis not present

## 2018-07-01 DIAGNOSIS — M9903 Segmental and somatic dysfunction of lumbar region: Secondary | ICD-10-CM | POA: Diagnosis not present

## 2018-07-01 DIAGNOSIS — M47812 Spondylosis without myelopathy or radiculopathy, cervical region: Secondary | ICD-10-CM | POA: Diagnosis not present

## 2018-07-01 DIAGNOSIS — M50323 Other cervical disc degeneration at C6-C7 level: Secondary | ICD-10-CM | POA: Diagnosis not present

## 2018-07-01 DIAGNOSIS — M50322 Other cervical disc degeneration at C5-C6 level: Secondary | ICD-10-CM | POA: Diagnosis not present

## 2018-07-02 ENCOUNTER — Ambulatory Visit: Payer: Federal, State, Local not specified - PPO | Admitting: Internal Medicine

## 2018-07-02 ENCOUNTER — Other Ambulatory Visit: Payer: Federal, State, Local not specified - PPO

## 2018-07-02 ENCOUNTER — Encounter: Payer: Self-pay | Admitting: Internal Medicine

## 2018-07-02 VITALS — BP 140/100 | HR 78 | Temp 98.3°F | Ht 64.0 in | Wt 136.0 lb

## 2018-07-02 DIAGNOSIS — R300 Dysuria: Secondary | ICD-10-CM

## 2018-07-02 LAB — POCT URINALYSIS DIPSTICK
BILIRUBIN UA: NEGATIVE
Blood, UA: NEGATIVE
Glucose, UA: NEGATIVE
Ketones, UA: NEGATIVE
Leukocytes, UA: NEGATIVE
Nitrite, UA: NEGATIVE
PH UA: 6 (ref 5.0–8.0)
PROTEIN UA: NEGATIVE
Spec Grav, UA: 1.01 (ref 1.010–1.025)
UROBILINOGEN UA: 0.2 U/dL

## 2018-07-02 MED ORDER — NITROFURANTOIN MONOHYD MACRO 100 MG PO CAPS: 100.0000 mg | ORAL_CAPSULE | Freq: Two times a day (BID) | ORAL | 0 refills | Status: DC

## 2018-07-02 MED ORDER — FLUCONAZOLE 150 MG PO TABS: 150.0000 mg | ORAL_TABLET | Freq: Once | ORAL | 0 refills | Status: AC

## 2018-07-02 NOTE — Assessment & Plan Note (Signed)
Rx for macrobid. POC U/A unrevealing and sending for urine culture. If negative will stop macrobid. Rx for diflucan in case of yeast infection.

## 2018-07-02 NOTE — Patient Instructions (Signed)
We have sent in macrobid to take 1 pill twice a day for 1 week.   We have sent in the diflucan in case of yeast infection.

## 2018-07-02 NOTE — Progress Notes (Signed)
   Subjective:   Patient ID: Michele Mills, female    DOB: 26-Apr-1976, 43 y.o.   MRN: [id number]  HPI The patient is a 43 YO female coming in for uti symptoms. Started about 1 week ago. Having burning, abdominal pain, low back pain, frequency and urgency. Denies fevers or chills or nausea or vomiting. Has not tried anything. Overall stable to mild worsening. She does have endometriosis and could be having a flare of this. She did take something over the counter and is not sure as that seemed like it helped some.   Review of Systems  Constitutional: Negative.   Respiratory: Negative.   Cardiovascular: Negative.   Gastrointestinal: Positive for abdominal pain. Negative for abdominal distention, constipation, diarrhea, nausea and vomiting.  Genitourinary: Positive for dysuria, frequency and urgency.  Musculoskeletal: Negative.   Skin: Negative.     Objective:  Physical Exam Constitutional:      Appearance: She is well-developed.  HENT:     Head: Normocephalic and atraumatic.  Neck:     Musculoskeletal: Normal range of motion.  Cardiovascular:     Rate and Rhythm: Normal rate and regular rhythm.  Pulmonary:     Effort: Pulmonary effort is normal. No respiratory distress.     Breath sounds: Normal breath sounds. No wheezing or rales.  Abdominal:     General: Bowel sounds are normal. There is no distension.     Palpations: Abdomen is soft.     Tenderness: There is abdominal tenderness. There is no rebound.     Comments: Minimal suprapubic tenderness  Musculoskeletal:     Comments: SI tenderness bilaterally  Skin:    General: Skin is warm and dry.  Neurological:     Mental Status: She is alert and oriented to person, place, and time.     Coordination: Coordination normal.     Vitals:   07/02/18 1513  BP: (!) 140/100  Pulse: 78  Temp: 98.3 F (36.8 C)  TempSrc: Oral  SpO2: 99%  Weight: 136 lb (61.7 kg)  Height: 5\' 4"  (1.626 m)    Assessment & Plan:

## 2018-07-03 LAB — URINE CULTURE
MICRO NUMBER:: 8729
RESULT: NO GROWTH
SPECIMEN QUALITY:: ADEQUATE

## 2018-07-09 DIAGNOSIS — R102 Pelvic and perineal pain: Secondary | ICD-10-CM | POA: Diagnosis not present

## 2018-07-09 DIAGNOSIS — R3915 Urgency of urination: Secondary | ICD-10-CM | POA: Diagnosis not present

## 2018-07-09 DIAGNOSIS — N809 Endometriosis, unspecified: Secondary | ICD-10-CM | POA: Diagnosis not present

## 2018-07-09 DIAGNOSIS — N94819 Vulvodynia, unspecified: Secondary | ICD-10-CM | POA: Diagnosis not present

## 2018-07-22 DIAGNOSIS — M47812 Spondylosis without myelopathy or radiculopathy, cervical region: Secondary | ICD-10-CM | POA: Diagnosis not present

## 2018-07-22 DIAGNOSIS — M50323 Other cervical disc degeneration at C6-C7 level: Secondary | ICD-10-CM | POA: Diagnosis not present

## 2018-07-22 DIAGNOSIS — M50322 Other cervical disc degeneration at C5-C6 level: Secondary | ICD-10-CM | POA: Diagnosis not present

## 2018-07-22 DIAGNOSIS — M9903 Segmental and somatic dysfunction of lumbar region: Secondary | ICD-10-CM | POA: Diagnosis not present

## 2018-07-26 DIAGNOSIS — M6281 Muscle weakness (generalized): Secondary | ICD-10-CM | POA: Diagnosis not present

## 2018-07-26 DIAGNOSIS — R278 Other lack of coordination: Secondary | ICD-10-CM | POA: Diagnosis not present

## 2018-07-26 DIAGNOSIS — M62838 Other muscle spasm: Secondary | ICD-10-CM | POA: Diagnosis not present

## 2018-07-26 DIAGNOSIS — K5900 Constipation, unspecified: Secondary | ICD-10-CM | POA: Diagnosis not present

## 2018-08-04 DIAGNOSIS — M50322 Other cervical disc degeneration at C5-C6 level: Secondary | ICD-10-CM | POA: Diagnosis not present

## 2018-08-04 DIAGNOSIS — M9903 Segmental and somatic dysfunction of lumbar region: Secondary | ICD-10-CM | POA: Diagnosis not present

## 2018-08-04 DIAGNOSIS — M47812 Spondylosis without myelopathy or radiculopathy, cervical region: Secondary | ICD-10-CM | POA: Diagnosis not present

## 2018-08-04 DIAGNOSIS — M50323 Other cervical disc degeneration at C6-C7 level: Secondary | ICD-10-CM | POA: Diagnosis not present

## 2018-08-18 DIAGNOSIS — M47812 Spondylosis without myelopathy or radiculopathy, cervical region: Secondary | ICD-10-CM | POA: Diagnosis not present

## 2018-08-18 DIAGNOSIS — M50323 Other cervical disc degeneration at C6-C7 level: Secondary | ICD-10-CM | POA: Diagnosis not present

## 2018-08-18 DIAGNOSIS — M9903 Segmental and somatic dysfunction of lumbar region: Secondary | ICD-10-CM | POA: Diagnosis not present

## 2018-08-18 DIAGNOSIS — M50322 Other cervical disc degeneration at C5-C6 level: Secondary | ICD-10-CM | POA: Diagnosis not present

## 2018-09-01 DIAGNOSIS — M50322 Other cervical disc degeneration at C5-C6 level: Secondary | ICD-10-CM | POA: Diagnosis not present

## 2018-09-01 DIAGNOSIS — M50323 Other cervical disc degeneration at C6-C7 level: Secondary | ICD-10-CM | POA: Diagnosis not present

## 2018-09-01 DIAGNOSIS — M9903 Segmental and somatic dysfunction of lumbar region: Secondary | ICD-10-CM | POA: Diagnosis not present

## 2018-09-01 DIAGNOSIS — M47812 Spondylosis without myelopathy or radiculopathy, cervical region: Secondary | ICD-10-CM | POA: Diagnosis not present

## 2018-11-16 ENCOUNTER — Encounter: Payer: Self-pay | Admitting: Internal Medicine

## 2018-11-16 ENCOUNTER — Other Ambulatory Visit: Payer: Self-pay | Admitting: *Deleted

## 2018-11-16 DIAGNOSIS — I10 Essential (primary) hypertension: Secondary | ICD-10-CM

## 2018-11-16 MED ORDER — HYDROCHLOROTHIAZIDE 12.5 MG PO CAPS: 12.5000 mg | ORAL_CAPSULE | Freq: Every day | ORAL | 0 refills | Status: DC

## 2018-12-27 DIAGNOSIS — Z01419 Encounter for gynecological examination (general) (routine) without abnormal findings: Secondary | ICD-10-CM | POA: Diagnosis not present

## 2018-12-27 DIAGNOSIS — Z1231 Encounter for screening mammogram for malignant neoplasm of breast: Secondary | ICD-10-CM | POA: Diagnosis not present

## 2018-12-27 DIAGNOSIS — Z6822 Body mass index (BMI) 22.0-22.9, adult: Secondary | ICD-10-CM | POA: Diagnosis not present

## 2018-12-27 LAB — HM MAMMOGRAPHY

## 2018-12-28 LAB — HM MAMMOGRAPHY

## 2018-12-29 ENCOUNTER — Encounter: Payer: Self-pay | Admitting: Internal Medicine

## 2019-01-10 ENCOUNTER — Encounter: Payer: Self-pay | Admitting: Internal Medicine

## 2019-01-13 LAB — HM MAMMOGRAPHY

## 2019-02-01 DIAGNOSIS — K080 Exfoliation of teeth due to systemic causes: Secondary | ICD-10-CM | POA: Diagnosis not present

## 2019-02-16 ENCOUNTER — Other Ambulatory Visit: Payer: Self-pay | Admitting: Internal Medicine

## 2019-02-16 DIAGNOSIS — I10 Essential (primary) hypertension: Secondary | ICD-10-CM

## 2019-02-16 MED ORDER — HYDROCHLOROTHIAZIDE 12.5 MG PO CAPS: 12.5000 mg | ORAL_CAPSULE | Freq: Every day | ORAL | 0 refills | Status: DC

## 2019-02-18 ENCOUNTER — Telehealth: Payer: Self-pay

## 2019-02-18 NOTE — Telephone Encounter (Signed)
Per PCP, pt is due for follow up appt.   lvm for pt informing of same.

## 2019-02-28 DIAGNOSIS — M62838 Other muscle spasm: Secondary | ICD-10-CM | POA: Diagnosis not present

## 2019-02-28 DIAGNOSIS — M6281 Muscle weakness (generalized): Secondary | ICD-10-CM | POA: Diagnosis not present

## 2019-02-28 DIAGNOSIS — K5900 Constipation, unspecified: Secondary | ICD-10-CM | POA: Diagnosis not present

## 2019-02-28 DIAGNOSIS — N94819 Vulvodynia, unspecified: Secondary | ICD-10-CM | POA: Diagnosis not present

## 2019-03-01 ENCOUNTER — Ambulatory Visit (INDEPENDENT_AMBULATORY_CARE_PROVIDER_SITE_OTHER): Payer: Federal, State, Local not specified - PPO | Admitting: Internal Medicine

## 2019-03-01 ENCOUNTER — Encounter: Payer: Self-pay | Admitting: Internal Medicine

## 2019-03-01 ENCOUNTER — Other Ambulatory Visit (INDEPENDENT_AMBULATORY_CARE_PROVIDER_SITE_OTHER): Payer: Federal, State, Local not specified - PPO

## 2019-03-01 ENCOUNTER — Other Ambulatory Visit: Payer: Self-pay

## 2019-03-01 VITALS — BP 126/88 | HR 68 | Temp 98.0°F | Resp 16 | Ht 64.0 in | Wt 137.0 lb

## 2019-03-01 DIAGNOSIS — Z23 Encounter for immunization: Secondary | ICD-10-CM | POA: Diagnosis not present

## 2019-03-01 DIAGNOSIS — I10 Essential (primary) hypertension: Secondary | ICD-10-CM

## 2019-03-01 DIAGNOSIS — Z0000 Encounter for general adult medical examination without abnormal findings: Secondary | ICD-10-CM | POA: Diagnosis not present

## 2019-03-01 DIAGNOSIS — J301 Allergic rhinitis due to pollen: Secondary | ICD-10-CM | POA: Diagnosis not present

## 2019-03-01 LAB — HEPATIC FUNCTION PANEL
ALT: 11 U/L (ref 0–35)
AST: 14 U/L (ref 0–37)
Albumin: 4.3 g/dL (ref 3.5–5.2)
Alkaline Phosphatase: 41 U/L (ref 39–117)
Bilirubin, Direct: 0.1 mg/dL (ref 0.0–0.3)
Total Bilirubin: 0.5 mg/dL (ref 0.2–1.2)
Total Protein: 6.9 g/dL (ref 6.0–8.3)

## 2019-03-01 LAB — CBC WITH DIFFERENTIAL/PLATELET
Basophils Absolute: 0 10*3/uL (ref 0.0–0.1)
Basophils Relative: 0.5 % (ref 0.0–3.0)
Eosinophils Absolute: 0.2 10*3/uL (ref 0.0–0.7)
Eosinophils Relative: 3.1 % (ref 0.0–5.0)
HCT: 42.3 % (ref 36.0–46.0)
Hemoglobin: 14.2 g/dL (ref 12.0–15.0)
Lymphocytes Relative: 35 % (ref 12.0–46.0)
Lymphs Abs: 1.8 10*3/uL (ref 0.7–4.0)
MCHC: 33.5 g/dL (ref 30.0–36.0)
MCV: 90.1 fl (ref 78.0–100.0)
Monocytes Absolute: 0.4 10*3/uL (ref 0.1–1.0)
Monocytes Relative: 7.5 % (ref 3.0–12.0)
Neutro Abs: 2.8 10*3/uL (ref 1.4–7.7)
Neutrophils Relative %: 53.9 % (ref 43.0–77.0)
Platelets: 301 10*3/uL (ref 150.0–400.0)
RBC: 4.69 Mil/uL (ref 3.87–5.11)
RDW: 12.7 % (ref 11.5–15.5)
WBC: 5.2 10*3/uL (ref 4.0–10.5)

## 2019-03-01 LAB — BASIC METABOLIC PANEL
BUN: 12 mg/dL (ref 6–23)
CO2: 31 mEq/L (ref 19–32)
Calcium: 9.5 mg/dL (ref 8.4–10.5)
Chloride: 99 mEq/L (ref 96–112)
Creatinine, Ser: 0.73 mg/dL (ref 0.40–1.20)
GFR: 87.4 mL/min (ref >60.00)
Glucose, Bld: 90 mg/dL (ref 70–99)
Potassium: 4.2 mEq/L (ref 3.5–5.1)
Sodium: 139 mEq/L (ref 135–145)

## 2019-03-01 LAB — LIPID PANEL
Cholesterol: 189 mg/dL (ref 0–200)
HDL: 56.7 mg/dL (ref >39.00)
LDL Cholesterol: 100 mg/dL — ABNORMAL HIGH (ref 0–99)
NonHDL: 132.07
Total CHOL/HDL Ratio: 3
Triglycerides: 162 mg/dL — ABNORMAL HIGH (ref 0.0–149.0)
VLDL: 32.4 mg/dL (ref 0.0–40.0)

## 2019-03-01 LAB — TSH: TSH: 1.72 u[IU]/mL (ref 0.35–4.50)

## 2019-03-01 MED ORDER — LEVOCETIRIZINE DIHYDROCHLORIDE 5 MG PO TABS: 5.0000 mg | ORAL_TABLET | Freq: Every evening | ORAL | 1 refills | Status: DC

## 2019-03-01 NOTE — Progress Notes (Signed)
Subjective:  Patient ID: Michele Mills, female    DOB: [birth year]  Age: 43 y.o. MRN: [id number]  CC: Annual Exam and Hypertension   HPI Michele Mills presents for a CPX.  She tells me her blood pressure has been well controlled.  She is active and denies any recent episodes of CP, DOE, palpitations, edema, or fatigue.  Outpatient Medications Prior to Visit  Medication Sig Dispense Refill  . fluconazole (DIFLUCAN) 150 MG tablet     . hydrochlorothiazide (MICROZIDE) 12.5 MG capsule Take 1 capsule (12.5 mg total) by mouth daily. 30 capsule 0  . nebivolol (BYSTOLIC) 5 MG tablet Take 1 tablet (5 mg total) by mouth daily. 90 tablet 1  . fluconazole (DIFLUCAN) 100 MG tablet Take 100 mg by mouth once a week.    Marland Kitchen ZORVOLEX 18 MG CAPS TAKE ONE CAPSULE BY MOUTH THREE TIMES DAILY with meals 90 capsule 1   No facility-administered medications prior to visit.     ROS Review of Systems  Constitutional: Negative for diaphoresis, fatigue and unexpected weight change.  HENT: Positive for congestion, postnasal drip and rhinorrhea. Negative for sinus pressure and sore throat.   Eyes: Negative for visual disturbance.  Respiratory: Negative for cough, chest tightness, shortness of breath and wheezing.   Cardiovascular: Negative for chest pain, palpitations and leg swelling.  Gastrointestinal: Negative for abdominal pain, constipation, diarrhea, nausea and vomiting.  Endocrine: Negative.   Genitourinary: Negative.  Negative for difficulty urinating and hematuria.  Musculoskeletal: Negative.  Negative for arthralgias and myalgias.  Skin: Negative for color change and pallor.  Neurological: Negative.  Negative for dizziness, weakness, light-headedness and headaches.  Hematological: Negative for adenopathy. Does not bruise/bleed easily.  Psychiatric/Behavioral: Negative.     Objective:  BP 126/88 (BP Location: Left Arm, Patient Position: Sitting, Cuff Size: Normal)   Pulse 68   Temp 98 F (36.7 C)  (Oral)   Resp 16   Ht 5\' 4"  (1.626 m)   Wt 137 lb (62.1 kg)   LMP 02/15/2019 (Within Days)   SpO2 99%   BMI 23.52 kg/m   BP Readings from Last 3 Encounters:  03/01/19 126/88  07/02/18 (!) 140/100  06/14/18 140/90    Wt Readings from Last 3 Encounters:  03/01/19 137 lb (62.1 kg)  07/02/18 136 lb (61.7 kg)  06/14/18 136 lb (61.7 kg)    Physical Exam Vitals signs reviewed.  Constitutional:      Appearance: Normal appearance.  HENT:     Nose: Nose normal. No mucosal edema, congestion or rhinorrhea.     Right Nostril: No epistaxis.     Left Nostril: No epistaxis.     Right Sinus: No maxillary sinus tenderness or frontal sinus tenderness.     Left Sinus: No maxillary sinus tenderness or frontal sinus tenderness.     Mouth/Throat:     Mouth: Mucous membranes are moist.     Pharynx: No oropharyngeal exudate.  Eyes:     General: No scleral icterus.    Conjunctiva/sclera: Conjunctivae normal.  Neck:     Musculoskeletal: Normal range of motion. No neck rigidity or muscular tenderness.  Cardiovascular:     Rate and Rhythm: Normal rate and regular rhythm.     Heart sounds: No murmur.  Pulmonary:     Effort: Pulmonary effort is normal. No respiratory distress.     Breath sounds: No stridor. No wheezing, rhonchi or rales.  Abdominal:     General: Abdomen is flat. Bowel sounds are normal. There is no  distension.     Palpations: There is no hepatomegaly or splenomegaly.     Tenderness: There is no abdominal tenderness.  Musculoskeletal: Normal range of motion.     Right lower leg: No edema.     Left lower leg: No edema.  Lymphadenopathy:     Cervical: No cervical adenopathy.  Skin:    General: Skin is warm and dry.  Neurological:     General: No focal deficit present.     Mental Status: She is alert and oriented to person, place, and time. Mental status is at baseline.  Psychiatric:        Mood and Affect: Mood normal.     Lab Results  Component Value Date   WBC 5.2  03/01/2019   HGB 14.2 03/01/2019   HCT 42.3 03/01/2019   PLT 301.0 03/01/2019   GLUCOSE 90 03/01/2019   CHOL 189 03/01/2019   TRIG 162.0 (H) 03/01/2019   HDL 56.70 03/01/2019   LDLCALC 100 (H) 03/01/2019   ALT 11 03/01/2019   AST 14 03/01/2019   NA 139 03/01/2019   K 4.2 03/01/2019   CL 99 03/01/2019   CREATININE 0.73 03/01/2019   BUN 12 03/01/2019   CO2 31 03/01/2019   TSH 1.72 03/01/2019    US Pelvic Complete With Transvaginal  Result Date: 05/13/2017 CLINICAL DATA:  Follow-up ovarian cyst, endometriosis EXAM: TRANSABDOMINAL AND TRANSVAGINAL ULTRASOUND OF PELVIS TECHNIQUE: Both transabdominal and transvaginal ultrasound examinations of the pelvis were performed. Transabdominal technique was performed for global imaging of the pelvis including uterus, ovaries, adnexal regions, and pelvic cul-de-sac. It was necessary to proceed with endovaginal exam following the transabdominal exam to visualize the endometrium and bilateral ovaries. COMPARISON:  MR pelvis dated 03/09/2017. Pelvic ultrasound dated 01/30/2017. FINDINGS: Uterus Measurements: 8.9 x 5.9 x 6.5 cm. No fibroids or other mass visualized. Endometrium Thickness: 9 mm.  No focal abnormality visualized. Right ovary Measurements: 4.1 x 1.7 x 1.9 cm. Prior dominant 6.7 cm cyst/follicle has resolved. Currently, a dominant 1.6 cm follicle is present, physiologic Left ovary Measurements: 4.9 x 2.4 x 2.5 cm. Dominant 3.1 x 2.2 x 2.5 cm cyst/follicle, likely physiologic. Other findings No abnormal free fluid. IMPRESSION: Prior dominant 6.7 cm right ovarian cyst/follicle has resolved. Dominant 3.1 cm left ovarian cyst/follicle on the current study is likely physiologic. Negative pelvic ultrasound. Electronically Signed   By: Julian Hy M.D.   On: 05/13/2017 14:52    Assessment & Plan:   Michele Mills was seen today for annual exam and hypertension.  Diagnoses and all orders for this visit:  Essential hypertension- Her blood pressure is  adequately well controlled.  Electrolytes and renal function are normal.  Will continue the current combination of nebivolol and hydrochlorothiazide. -     TSH; Future -     Hepatic function panel; Future -     CBC with Differential/Platelet; Future -     Basic metabolic panel; Future  Routine general medical examination at a health care facility- Exam completed, labs reviewed, vaccines reviewed and updated, Pap and mammogram are up-to-date, patient education was given. -     HIV Antibody (routine testing w rflx); Future -     Lipid panel; Future  Seasonal allergic rhinitis due to pollen -     levocetirizine (XYZAL) 5 MG tablet; Take 1 tablet (5 mg total) by mouth every evening.  Need for influenza vaccination -     Flu Vaccine QUAD 36+ mos IM   I have discontinued Kaci Helling's Zorvolex.  I am also having her start on levocetirizine. Additionally, I am having her maintain her nebivolol, hydrochlorothiazide, and fluconazole.  Meds ordered this encounter  Medications  . levocetirizine (XYZAL) 5 MG tablet    Sig: Take 1 tablet (5 mg total) by mouth every evening.    Dispense:  90 tablet    Refill:  1     Follow-up: Return in about 6 months (around 08/29/2019).  [name], MD

## 2019-03-01 NOTE — Patient Instructions (Signed)
Health Maintenance, Female Adopting a healthy lifestyle and getting preventive care are important in promoting health and wellness. Ask your health care provider about:  The right schedule for you to have regular tests and exams.  Things you can do on your own to prevent diseases and keep yourself healthy. What should I know about diet, weight, and exercise? Eat a healthy diet   Eat a diet that includes plenty of vegetables, fruits, low-fat dairy products, and lean protein.  Do not eat a lot of foods that are high in solid fats, added sugars, or sodium. Maintain a healthy weight Body mass index (BMI) is used to identify weight problems. It estimates body fat based on height and weight. Your health care provider can help determine your BMI and help you achieve or maintain a healthy weight. Get regular exercise Get regular exercise. This is one of the most important things you can do for your health. Most adults should:  Exercise for at least 150 minutes each week. The exercise should increase your heart rate and make you sweat (moderate-intensity exercise).  Do strengthening exercises at least twice a week. This is in addition to the moderate-intensity exercise.  Spend less time sitting. Even light physical activity can be beneficial. Watch cholesterol and blood lipids Have your blood tested for lipids and cholesterol at 42 years of age, then have this test every 5 years. Have your cholesterol levels checked more often if:  Your lipid or cholesterol levels are high.  You are older than 42 years of age.  You are at high risk for heart disease. What should I know about cancer screening? Depending on your health history and family history, you may need to have cancer screening at various ages. This may include screening for:  Breast cancer.  Cervical cancer.  Colorectal cancer.  Skin cancer.  Lung cancer. What should I know about heart disease, diabetes, and high blood  pressure? Blood pressure and heart disease  High blood pressure causes heart disease and increases the risk of stroke. This is more likely to develop in people who have high blood pressure readings, are of African descent, or are overweight.  Have your blood pressure checked: ? Every 3-5 years if you are 18-39 years of age. ? Every year if you are 40 years old or older. Diabetes Have regular diabetes screenings. This checks your fasting blood sugar level. Have the screening done:  Once every three years after age 40 if you are at a normal weight and have a low risk for diabetes.  More often and at a younger age if you are overweight or have a high risk for diabetes. What should I know about preventing infection? Hepatitis B If you have a higher risk for hepatitis B, you should be screened for this virus. Talk with your health care provider to find out if you are at risk for hepatitis B infection. Hepatitis C Testing is recommended for:  Everyone born from 1945 through 1965.  Anyone with known risk factors for hepatitis C. Sexually transmitted infections (STIs)  Get screened for STIs, including gonorrhea and chlamydia, if: ? You are sexually active and are younger than 42 years of age. ? You are older than 42 years of age and your health care provider tells you that you are at risk for this type of infection. ? Your sexual activity has changed since you were last screened, and you are at increased risk for chlamydia or gonorrhea. Ask your health care provider if   you are at risk.  Ask your health care provider about whether you are at high risk for HIV. Your health care provider may recommend a prescription medicine to help prevent HIV infection. If you choose to take medicine to prevent HIV, you should first get tested for HIV. You should then be tested every 3 months for as long as you are taking the medicine. Pregnancy  If you are about to stop having your period (premenopausal) and  you may become pregnant, seek counseling before you get pregnant.  Take 400 to 800 micrograms (mcg) of folic acid every day if you become pregnant.  Ask for birth control (contraception) if you want to prevent pregnancy. Osteoporosis and menopause Osteoporosis is a disease in which the bones lose minerals and strength with aging. This can result in bone fractures. If you are 65 years old or older, or if you are at risk for osteoporosis and fractures, ask your health care provider if you should:  Be screened for bone loss.  Take a calcium or vitamin D supplement to lower your risk of fractures.  Be given hormone replacement therapy (HRT) to treat symptoms of menopause. Follow these instructions at home: Lifestyle  Do not use any products that contain nicotine or tobacco, such as cigarettes, e-cigarettes, and chewing tobacco. If you need help quitting, ask your health care provider.  Do not use street drugs.  Do not share needles.  Ask your health care provider for help if you need support or information about quitting drugs. Alcohol use  Do not drink alcohol if: ? Your health care provider tells you not to drink. ? You are pregnant, may be pregnant, or are planning to become pregnant.  If you drink alcohol: ? Limit how much you use to 0-1 drink a day. ? Limit intake if you are breastfeeding.  Be aware of how much alcohol is in your drink. In the [place] one drink equals one 12 oz bottle of beer (355 mL), one 5 oz glass of wine (148 mL), or one 1 oz glass of hard liquor (44 mL). General instructions  Schedule regular health, dental, and eye exams.  Stay current with your vaccines.  Tell your health care provider if: ? You often feel depressed. ? You have ever been abused or do not feel safe at home. Summary  Adopting a healthy lifestyle and getting preventive care are important in promoting health and wellness.  Follow your health care provider's instructions about healthy  diet, exercising, and getting tested or screened for diseases.  Follow your health care provider's instructions on monitoring your cholesterol and blood pressure. This information is not intended to replace advice given to you by your health care provider. Make sure you discuss any questions you have with your health care provider. Document Released: 12/30/2010 Document Revised: 06/09/2018 Document Reviewed: 06/09/2018 Elsevier Patient Education  2020 [employer]  

## 2019-03-02 ENCOUNTER — Encounter: Payer: Self-pay | Admitting: Internal Medicine

## 2019-03-02 LAB — HIV ANTIBODY (ROUTINE TESTING W REFLEX): HIV 1&2 Ab, 4th Generation: NONREACTIVE

## 2019-03-09 ENCOUNTER — Other Ambulatory Visit: Payer: Self-pay | Admitting: Internal Medicine

## 2019-03-09 DIAGNOSIS — I10 Essential (primary) hypertension: Secondary | ICD-10-CM

## 2019-03-09 MED ORDER — NEBIVOLOL HCL 5 MG PO TABS: 5.0000 mg | ORAL_TABLET | Freq: Every day | ORAL | 1 refills | Status: DC

## 2019-03-10 DIAGNOSIS — Z6823 Body mass index (BMI) 23.0-23.9, adult: Secondary | ICD-10-CM | POA: Diagnosis not present

## 2019-03-10 DIAGNOSIS — N903 Dysplasia of vulva, unspecified: Secondary | ICD-10-CM | POA: Diagnosis not present

## 2019-03-10 DIAGNOSIS — I10 Essential (primary) hypertension: Secondary | ICD-10-CM | POA: Diagnosis not present

## 2019-03-18 ENCOUNTER — Other Ambulatory Visit: Payer: Self-pay | Admitting: Internal Medicine

## 2019-03-18 DIAGNOSIS — I10 Essential (primary) hypertension: Secondary | ICD-10-CM

## 2019-03-18 MED ORDER — HYDROCHLOROTHIAZIDE 12.5 MG PO CAPS: 12.5000 mg | ORAL_CAPSULE | Freq: Every day | ORAL | 1 refills | Status: DC

## 2019-04-01 DIAGNOSIS — Z6823 Body mass index (BMI) 23.0-23.9, adult: Secondary | ICD-10-CM | POA: Diagnosis not present

## 2019-04-01 DIAGNOSIS — N809 Endometriosis, unspecified: Secondary | ICD-10-CM | POA: Diagnosis not present

## 2019-04-01 DIAGNOSIS — N94819 Vulvodynia, unspecified: Secondary | ICD-10-CM | POA: Diagnosis not present

## 2019-04-01 DIAGNOSIS — R198 Other specified symptoms and signs involving the digestive system and abdomen: Secondary | ICD-10-CM | POA: Diagnosis not present

## 2019-04-12 DIAGNOSIS — Z1283 Encounter for screening for malignant neoplasm of skin: Secondary | ICD-10-CM | POA: Diagnosis not present

## 2019-04-12 DIAGNOSIS — D485 Neoplasm of uncertain behavior of skin: Secondary | ICD-10-CM | POA: Diagnosis not present

## 2019-04-12 DIAGNOSIS — D225 Melanocytic nevi of trunk: Secondary | ICD-10-CM | POA: Diagnosis not present

## 2019-07-28 DIAGNOSIS — M50323 Other cervical disc degeneration at C6-C7 level: Secondary | ICD-10-CM | POA: Diagnosis not present

## 2019-07-28 DIAGNOSIS — M50322 Other cervical disc degeneration at C5-C6 level: Secondary | ICD-10-CM | POA: Diagnosis not present

## 2019-07-28 DIAGNOSIS — M9903 Segmental and somatic dysfunction of lumbar region: Secondary | ICD-10-CM | POA: Diagnosis not present

## 2019-07-28 DIAGNOSIS — M47812 Spondylosis without myelopathy or radiculopathy, cervical region: Secondary | ICD-10-CM | POA: Diagnosis not present

## 2019-08-01 DIAGNOSIS — M47812 Spondylosis without myelopathy or radiculopathy, cervical region: Secondary | ICD-10-CM | POA: Diagnosis not present

## 2019-08-01 DIAGNOSIS — M9903 Segmental and somatic dysfunction of lumbar region: Secondary | ICD-10-CM | POA: Diagnosis not present

## 2019-08-01 DIAGNOSIS — M50323 Other cervical disc degeneration at C6-C7 level: Secondary | ICD-10-CM | POA: Diagnosis not present

## 2019-08-01 DIAGNOSIS — M50322 Other cervical disc degeneration at C5-C6 level: Secondary | ICD-10-CM | POA: Diagnosis not present

## 2019-08-03 DIAGNOSIS — N809 Endometriosis, unspecified: Secondary | ICD-10-CM | POA: Diagnosis not present

## 2019-08-03 DIAGNOSIS — R102 Pelvic and perineal pain: Secondary | ICD-10-CM | POA: Diagnosis not present

## 2019-08-03 DIAGNOSIS — N94819 Vulvodynia, unspecified: Secondary | ICD-10-CM | POA: Diagnosis not present

## 2019-08-03 DIAGNOSIS — G588 Other specified mononeuropathies: Secondary | ICD-10-CM | POA: Diagnosis not present

## 2019-08-09 DIAGNOSIS — M50322 Other cervical disc degeneration at C5-C6 level: Secondary | ICD-10-CM | POA: Diagnosis not present

## 2019-08-09 DIAGNOSIS — M9903 Segmental and somatic dysfunction of lumbar region: Secondary | ICD-10-CM | POA: Diagnosis not present

## 2019-08-09 DIAGNOSIS — M47812 Spondylosis without myelopathy or radiculopathy, cervical region: Secondary | ICD-10-CM | POA: Diagnosis not present

## 2019-08-09 DIAGNOSIS — M50323 Other cervical disc degeneration at C6-C7 level: Secondary | ICD-10-CM | POA: Diagnosis not present

## 2019-08-19 DIAGNOSIS — D225 Melanocytic nevi of trunk: Secondary | ICD-10-CM | POA: Diagnosis not present

## 2019-08-19 DIAGNOSIS — L720 Epidermal cyst: Secondary | ICD-10-CM | POA: Diagnosis not present

## 2019-08-19 DIAGNOSIS — Z1283 Encounter for screening for malignant neoplasm of skin: Secondary | ICD-10-CM | POA: Diagnosis not present

## 2019-08-31 DIAGNOSIS — L720 Epidermal cyst: Secondary | ICD-10-CM | POA: Diagnosis not present

## 2019-09-03 ENCOUNTER — Ambulatory Visit: Payer: Federal, State, Local not specified - PPO | Attending: Internal Medicine

## 2019-09-03 DIAGNOSIS — Z23 Encounter for immunization: Secondary | ICD-10-CM | POA: Insufficient documentation

## 2019-09-03 NOTE — Progress Notes (Signed)
   Covid-19 Vaccination [facility]  Name:  Michele Mills    MRN: [id number] DOB: 09/10/1975  09/03/2019  [name] was observed post Covid-19 immunization for 15 minutes without incident. She was provided with Vaccine Information Sheet and instruction to access the V-Safe system.   [name] was instructed to call 911 with any severe reactions post vaccine: Marland Kitchen Difficulty breathing  . Swelling of face and throat  . A fast heartbeat  . A bad rash all over body  . Dizziness and weakness   Immunizations Administered    Name Date Dose VIS Date Route   Pfizer COVID-19 Vaccine 09/03/2019  6:00 PM 0.3 mL 06/10/2019 Intramuscular   Manufacturer: [place]   Lot: [id number]   [place]: [id number]

## 2019-09-08 DIAGNOSIS — N809 Endometriosis, unspecified: Secondary | ICD-10-CM | POA: Diagnosis not present

## 2019-09-08 DIAGNOSIS — N900 Mild vulvar dysplasia: Secondary | ICD-10-CM | POA: Diagnosis not present

## 2019-09-08 DIAGNOSIS — I10 Essential (primary) hypertension: Secondary | ICD-10-CM | POA: Diagnosis not present

## 2019-09-08 DIAGNOSIS — N903 Dysplasia of vulva, unspecified: Secondary | ICD-10-CM | POA: Diagnosis not present

## 2019-09-14 ENCOUNTER — Ambulatory Visit: Payer: Federal, State, Local not specified - PPO | Admitting: Internal Medicine

## 2019-09-14 ENCOUNTER — Other Ambulatory Visit: Payer: Self-pay

## 2019-09-14 ENCOUNTER — Encounter: Payer: Self-pay | Admitting: Internal Medicine

## 2019-09-14 VITALS — BP 134/84 | HR 86 | Temp 98.3°F | Resp 16 | Ht 64.0 in | Wt 131.0 lb

## 2019-09-14 DIAGNOSIS — I10 Essential (primary) hypertension: Secondary | ICD-10-CM

## 2019-09-14 DIAGNOSIS — R229 Localized swelling, mass and lump, unspecified: Secondary | ICD-10-CM | POA: Insufficient documentation

## 2019-09-14 LAB — CBC WITH DIFFERENTIAL/PLATELET
Basophils Absolute: 0 10*3/uL (ref 0.0–0.1)
Basophils Relative: 0.8 % (ref 0.0–3.0)
Eosinophils Absolute: 0.2 10*3/uL (ref 0.0–0.7)
Eosinophils Relative: 4 % (ref 0.0–5.0)
HCT: 40.2 % (ref 36.0–46.0)
Hemoglobin: 13.4 g/dL (ref 12.0–15.0)
Lymphocytes Relative: 31.9 % (ref 12.0–46.0)
Lymphs Abs: 1.3 10*3/uL (ref 0.7–4.0)
MCHC: 33.4 g/dL (ref 30.0–36.0)
MCV: 91.6 fl (ref 78.0–100.0)
Monocytes Absolute: 0.5 10*3/uL (ref 0.1–1.0)
Monocytes Relative: 10.8 % (ref 3.0–12.0)
Neutro Abs: 2.2 10*3/uL (ref 1.4–7.7)
Neutrophils Relative %: 52.5 % (ref 43.0–77.0)
Platelets: 293 10*3/uL (ref 150.0–400.0)
RBC: 4.38 Mil/uL (ref 3.87–5.11)
RDW: 12.6 % (ref 11.5–15.5)
WBC: 4.2 10*3/uL (ref 4.0–10.5)

## 2019-09-14 LAB — BASIC METABOLIC PANEL
BUN: 15 mg/dL (ref 6–23)
CO2: 32 mEq/L (ref 19–32)
Calcium: 9.1 mg/dL (ref 8.4–10.5)
Chloride: 100 mEq/L (ref 96–112)
Creatinine, Ser: 0.88 mg/dL (ref 0.40–1.20)
GFR: 70.26 mL/min (ref >60.00)
Glucose, Bld: 96 mg/dL (ref 70–99)
Potassium: 4.2 mEq/L (ref 3.5–5.1)
Sodium: 135 mEq/L (ref 135–145)

## 2019-09-14 NOTE — Patient Instructions (Signed)

## 2019-09-14 NOTE — Progress Notes (Signed)
Subjective:  Patient ID: Michele Mills, female    DOB: 07/16/1975  Age: 44 y.o. MRN: [id number]  CC: Hypertension  This visit occurred during the SARS-CoV-2 public health emergency.  Safety protocols were in place, including screening questions prior to the visit, additional usage of staff PPE, and extensive cleaning of exam room while observing appropriate contact time as indicated for disinfecting solutions.    HPI Michele Mills presents for f/up - She tells me her blood pressure has been well controlled.  She is active and denies any recent episodes of chest pain, shortness of breath, palpitations, dizziness, or lightheadedness.  She has had a growth on her back for several years but feels like it has been enlarging over the last few weeks.  She has been seeing a dermatologist and has been treated for infection with an oral antibiotics.  She tells me the area is not getting any better and she wants to know what to do next.  Outpatient Medications Prior to Visit  Medication Sig Dispense Refill  . levocetirizine (XYZAL) 5 MG tablet Take 1 tablet (5 mg total) by mouth every evening. 90 tablet 1  . sulfamethoxazole-trimethoprim (BACTRIM DS) 800-160 MG tablet Take 1 tablet by mouth 2 (two) times daily.    . hydrochlorothiazide (MICROZIDE) 12.5 MG capsule Take 1 capsule (12.5 mg total) by mouth daily. 90 capsule 1  . nebivolol (BYSTOLIC) 5 MG tablet Take 1 tablet (5 mg total) by mouth daily. 90 tablet 1  . fluconazole (DIFLUCAN) 150 MG tablet      No facility-administered medications prior to visit.    ROS Review of Systems  Constitutional: Negative.  Negative for chills, diaphoresis, fatigue and fever.  HENT: Negative.   Eyes: Negative for visual disturbance.  Respiratory: Negative for cough, chest tightness, shortness of breath and wheezing.   Cardiovascular: Negative for chest pain, palpitations and leg swelling.  Gastrointestinal: Negative for abdominal pain, constipation, diarrhea,  nausea and vomiting.  Endocrine: Negative.   Genitourinary: Negative.  Negative for difficulty urinating.  Musculoskeletal: Negative.  Negative for arthralgias and myalgias.  Skin: Negative.  Negative for color change.  Neurological: Negative.  Negative for dizziness, weakness and light-headedness.  Hematological: Negative.   Psychiatric/Behavioral: Negative.     Objective:  BP 134/84 (BP Location: Left Arm, Patient Position: Sitting, Cuff Size: Normal)   Pulse 86   Temp 98.3 F (36.8 C) (Oral)   Resp 16   Ht 5\' 4"  (1.626 m)   Wt 131 lb (59.4 kg)   SpO2 99%   BMI 22.49 kg/m   BP Readings from Last 3 Encounters:  09/14/19 134/84  03/01/19 126/88  07/02/18 (!) 140/100    Wt Readings from Last 3 Encounters:  09/14/19 131 lb (59.4 kg)  03/01/19 137 lb (62.1 kg)  07/02/18 136 lb (61.7 kg)    Physical Exam Vitals reviewed.  Constitutional:      Appearance: Normal appearance.  HENT:     Nose: Nose normal.     Mouth/Throat:     Mouth: Mucous membranes are moist.  Eyes:     General: No scleral icterus.    Conjunctiva/sclera: Conjunctivae normal.  Cardiovascular:     Rate and Rhythm: Normal rate and regular rhythm.     Heart sounds: No murmur.  Pulmonary:     Effort: Pulmonary effort is normal.     Breath sounds: No stridor. No wheezing, rhonchi or rales.  Abdominal:     General: Abdomen is flat. Bowel sounds are normal. There  is no distension.     Palpations: Abdomen is soft. There is no hepatomegaly, splenomegaly or mass.  Musculoskeletal:        General: Normal range of motion.     Cervical back: Neck supple.       Back:     Right lower leg: No edema.     Left lower leg: No edema.  Lymphadenopathy:     Cervical: No cervical adenopathy.  Skin:    General: Skin is warm and dry.     Findings: No rash.  Neurological:     General: No focal deficit present.     Mental Status: She is alert.     Lab Results  Component Value Date   WBC 4.2 09/14/2019   HGB  13.4 09/14/2019   HCT 40.2 09/14/2019   PLT 293.0 09/14/2019   GLUCOSE 96 09/14/2019   CHOL 189 03/01/2019   TRIG 162.0 (H) 03/01/2019   HDL 56.70 03/01/2019   LDLCALC 100 (H) 03/01/2019   ALT 11 03/01/2019   AST 14 03/01/2019   NA 135 09/14/2019   K 4.2 09/14/2019   CL 100 09/14/2019   CREATININE 0.88 09/14/2019   BUN 15 09/14/2019   CO2 32 09/14/2019   TSH 1.72 03/01/2019    US PELVIC COMPLETE WITH TRANSVAGINAL  Result Date: 05/13/2017 CLINICAL DATA:  Follow-up ovarian cyst, endometriosis EXAM: TRANSABDOMINAL AND TRANSVAGINAL ULTRASOUND OF PELVIS TECHNIQUE: Both transabdominal and transvaginal ultrasound examinations of the pelvis were performed. Transabdominal technique was performed for global imaging of the pelvis including uterus, ovaries, adnexal regions, and pelvic cul-de-sac. It was necessary to proceed with endovaginal exam following the transabdominal exam to visualize the endometrium and bilateral ovaries. COMPARISON:  MR pelvis dated 03/09/2017. Pelvic ultrasound dated 01/30/2017. FINDINGS: Uterus Measurements: 8.9 x 5.9 x 6.5 cm. No fibroids or other mass visualized. Endometrium Thickness: 9 mm.  No focal abnormality visualized. Right ovary Measurements: 4.1 x 1.7 x 1.9 cm. Prior dominant 6.7 cm cyst/follicle has resolved. Currently, a dominant 1.6 cm follicle is present, physiologic Left ovary Measurements: 4.9 x 2.4 x 2.5 cm. Dominant 3.1 x 2.2 x 2.5 cm cyst/follicle, likely physiologic. Other findings No abnormal free fluid. IMPRESSION: Prior dominant 6.7 cm right ovarian cyst/follicle has resolved. Dominant 3.1 cm left ovarian cyst/follicle on the current study is likely physiologic. Negative pelvic ultrasound. Electronically Signed   By: Julian Hy M.D.   On: 05/13/2017 14:52    Assessment & Plan:   [place] was seen today for hypertension.  Diagnoses and all orders for this visit:  Localized skin mass, lump, or swelling- I am concerned that what was previously  a benign lesion may have become malignant.  I have asked her to see general surgery to see if this needs to be excised. -     Ambulatory referral to General Surgery  Essential hypertension- Her blood pressure is adequately well controlled.  Electrolytes and renal function are normal.  Will continue the combination of nebivolol and hydrochlorothiazide. -     CBC with Differential/Platelet -     Basic metabolic panel -     nebivolol (BYSTOLIC) 5 MG tablet; Take 1 tablet (5 mg total) by mouth daily. -     hydrochlorothiazide (MICROZIDE) 12.5 MG capsule; Take 1 capsule (12.5 mg total) by mouth daily.   I have discontinued Merlinda Ebner's fluconazole. I am also having her maintain her levocetirizine, sulfamethoxazole-trimethoprim, nebivolol, and hydrochlorothiazide.  Meds ordered this encounter  Medications  . nebivolol (BYSTOLIC) 5 MG tablet  Sig: Take 1 tablet (5 mg total) by mouth daily.    Dispense:  90 tablet    Refill:  1  . hydrochlorothiazide (MICROZIDE) 12.5 MG capsule    Sig: Take 1 capsule (12.5 mg total) by mouth daily.    Dispense:  90 capsule    Refill:  1     Follow-up: Return in about 6 months (around 03/16/2020).  [name], MD

## 2019-09-15 ENCOUNTER — Encounter: Payer: Self-pay | Admitting: Internal Medicine

## 2019-09-15 MED ORDER — NEBIVOLOL HCL 5 MG PO TABS: 5.0000 mg | ORAL_TABLET | Freq: Every day | ORAL | 1 refills | Status: DC

## 2019-09-15 MED ORDER — HYDROCHLOROTHIAZIDE 12.5 MG PO CAPS: 12.5000 mg | ORAL_CAPSULE | Freq: Every day | ORAL | 1 refills | Status: DC

## 2019-09-23 ENCOUNTER — Encounter: Payer: Self-pay | Admitting: Internal Medicine

## 2019-09-24 ENCOUNTER — Ambulatory Visit: Payer: Federal, State, Local not specified - PPO | Attending: Internal Medicine

## 2019-09-24 DIAGNOSIS — Z23 Encounter for immunization: Secondary | ICD-10-CM

## 2019-09-24 NOTE — Progress Notes (Signed)
   Covid-19 Vaccination [facility]  Name:  Michele Mills    MRN: [id number] DOB: 1975-08-22  09/24/2019  Ms. Michele Mills was observed post Covid-19 immunization for 15 minutes without incident. She was provided with Vaccine Information Sheet and instruction to access the V-Safe system.   Ms. Michele Mills was instructed to call 911 with any severe reactions post vaccine: Marland Kitchen Difficulty breathing  . Swelling of face and throat  . A fast heartbeat  . A bad rash all over body  . Dizziness and weakness   Immunizations Administered    Name Date Dose VIS Date Route   Pfizer COVID-19 Vaccine 09/24/2019 10:40 AM 0.3 mL 06/10/2019 Intramuscular   Manufacturer: [place]   Lot: [id number]   [place]: [id number]

## 2019-10-14 DIAGNOSIS — D171 Benign lipomatous neoplasm of skin and subcutaneous tissue of trunk: Secondary | ICD-10-CM | POA: Diagnosis not present

## 2019-10-28 DIAGNOSIS — Z01818 Encounter for other preprocedural examination: Secondary | ICD-10-CM | POA: Diagnosis not present

## 2019-11-01 DIAGNOSIS — L723 Sebaceous cyst: Secondary | ICD-10-CM | POA: Diagnosis not present

## 2019-11-01 DIAGNOSIS — L720 Epidermal cyst: Secondary | ICD-10-CM | POA: Diagnosis not present

## 2019-11-02 DIAGNOSIS — R198 Other specified symptoms and signs involving the digestive system and abdomen: Secondary | ICD-10-CM | POA: Diagnosis not present

## 2019-11-02 DIAGNOSIS — R102 Pelvic and perineal pain: Secondary | ICD-10-CM | POA: Diagnosis not present

## 2019-11-02 DIAGNOSIS — G588 Other specified mononeuropathies: Secondary | ICD-10-CM | POA: Diagnosis not present

## 2019-11-02 DIAGNOSIS — N94819 Vulvodynia, unspecified: Secondary | ICD-10-CM | POA: Diagnosis not present

## 2020-01-30 LAB — HM MAMMOGRAPHY: HM Mammogram: NORMAL (ref 0–4)

## 2020-02-07 DIAGNOSIS — Z01419 Encounter for gynecological examination (general) (routine) without abnormal findings: Secondary | ICD-10-CM | POA: Diagnosis not present

## 2020-02-07 DIAGNOSIS — Z6822 Body mass index (BMI) 22.0-22.9, adult: Secondary | ICD-10-CM | POA: Diagnosis not present

## 2020-02-07 DIAGNOSIS — Z1231 Encounter for screening mammogram for malignant neoplasm of breast: Secondary | ICD-10-CM | POA: Diagnosis not present

## 2020-02-16 DIAGNOSIS — Z87412 Personal history of vulvar dysplasia: Secondary | ICD-10-CM | POA: Diagnosis not present

## 2020-02-16 DIAGNOSIS — N903 Dysplasia of vulva, unspecified: Secondary | ICD-10-CM | POA: Diagnosis not present

## 2020-02-16 DIAGNOSIS — Z6823 Body mass index (BMI) 23.0-23.9, adult: Secondary | ICD-10-CM | POA: Diagnosis not present

## 2020-02-16 DIAGNOSIS — I10 Essential (primary) hypertension: Secondary | ICD-10-CM | POA: Diagnosis not present

## 2020-02-16 DIAGNOSIS — N94819 Vulvodynia, unspecified: Secondary | ICD-10-CM | POA: Diagnosis not present

## 2020-03-07 ENCOUNTER — Encounter: Payer: Self-pay | Admitting: Internal Medicine

## 2020-03-07 ENCOUNTER — Other Ambulatory Visit: Payer: Self-pay

## 2020-03-07 ENCOUNTER — Ambulatory Visit: Payer: Federal, State, Local not specified - PPO | Admitting: Internal Medicine

## 2020-03-07 VITALS — BP 126/86 | HR 83 | Temp 98.5°F | Resp 16 | Ht 64.0 in | Wt 129.0 lb

## 2020-03-07 DIAGNOSIS — Z23 Encounter for immunization: Secondary | ICD-10-CM | POA: Diagnosis not present

## 2020-03-07 DIAGNOSIS — G588 Other specified mononeuropathies: Secondary | ICD-10-CM | POA: Diagnosis not present

## 2020-03-07 DIAGNOSIS — Z1159 Encounter for screening for other viral diseases: Secondary | ICD-10-CM | POA: Diagnosis not present

## 2020-03-07 DIAGNOSIS — Z0000 Encounter for general adult medical examination without abnormal findings: Secondary | ICD-10-CM | POA: Diagnosis not present

## 2020-03-07 DIAGNOSIS — N94819 Vulvodynia, unspecified: Secondary | ICD-10-CM | POA: Diagnosis not present

## 2020-03-07 DIAGNOSIS — R198 Other specified symptoms and signs involving the digestive system and abdomen: Secondary | ICD-10-CM | POA: Diagnosis not present

## 2020-03-07 DIAGNOSIS — I10 Essential (primary) hypertension: Secondary | ICD-10-CM | POA: Diagnosis not present

## 2020-03-07 MED ORDER — NEBIVOLOL HCL 5 MG PO TABS: 5.0000 mg | ORAL_TABLET | Freq: Every day | ORAL | 1 refills | Status: DC

## 2020-03-07 MED ORDER — HYDROCHLOROTHIAZIDE 12.5 MG PO CAPS: 12.5000 mg | ORAL_CAPSULE | Freq: Every day | ORAL | 1 refills | Status: DC

## 2020-03-07 NOTE — Progress Notes (Signed)
Subjective:  Patient ID: Michele Mills, female    DOB: [birth year]  Age: 44 y.o. MRN: [id number]  CC: Annual Exam and Hypertension  This visit occurred during the SARS-CoV-2 public health emergency.  Safety protocols were in place, including screening questions prior to the visit, additional usage of staff PPE, and extensive cleaning of exam room while observing appropriate contact time as indicated for disinfecting solutions.    HPI Michele Mills presents for a CPX.  She tells me her blood pressure has been well controlled.  She intermittently jogs about 2 miles and does not experience CP, DOE, palpitations, edema, or fatigue.  Outpatient Medications Prior to Visit  Medication Sig Dispense Refill  . levocetirizine (XYZAL) 5 MG tablet Take 1 tablet (5 mg total) by mouth every evening. 90 tablet 1  . hydrochlorothiazide (MICROZIDE) 12.5 MG capsule Take 1 capsule (12.5 mg total) by mouth daily. 90 capsule 1  . nebivolol (BYSTOLIC) 5 MG tablet Take 1 tablet (5 mg total) by mouth daily. 90 tablet 1  . sulfamethoxazole-trimethoprim (BACTRIM DS) 800-160 MG tablet Take 1 tablet by mouth 2 (two) times daily. (Patient not taking: Reported on 03/07/2020)     No facility-administered medications prior to visit.    ROS Review of Systems  Constitutional: Negative.  Negative for diaphoresis, fatigue and unexpected weight change.  HENT: Negative.   Eyes: Negative for visual disturbance.  Respiratory: Negative for cough, chest tightness, shortness of breath and wheezing.   Cardiovascular: Negative for chest pain, palpitations and leg swelling.  Gastrointestinal: Negative for abdominal pain, constipation, diarrhea, nausea and vomiting.  Endocrine: Negative.   Genitourinary: Negative.  Negative for difficulty urinating, dysuria and hematuria.  Musculoskeletal: Negative for arthralgias and myalgias.  Skin: Negative.  Negative for color change and pallor.  Neurological: Negative.  Negative for  dizziness, weakness and light-headedness.  Hematological: Negative for adenopathy. Does not bruise/bleed easily.  Psychiatric/Behavioral: Negative.     Objective:  BP 126/86   Pulse 83   Temp 98.5 F (36.9 C) (Oral)   Resp 16   Ht 5\' 4"  (1.626 m)   Wt 129 lb (58.5 kg)   SpO2 99%   BMI 22.14 kg/m   BP Readings from Last 3 Encounters:  03/07/20 126/86  09/14/19 134/84  03/01/19 126/88    Wt Readings from Last 3 Encounters:  03/07/20 129 lb (58.5 kg)  09/14/19 131 lb (59.4 kg)  03/01/19 137 lb (62.1 kg)    Physical Exam Vitals reviewed.  Constitutional:      Appearance: Normal appearance.  HENT:     Nose: Nose normal.     Mouth/Throat:     Mouth: Mucous membranes are moist.  Eyes:     General: No scleral icterus.    Conjunctiva/sclera: Conjunctivae normal.  Cardiovascular:     Rate and Rhythm: Normal rate and regular rhythm.     Heart sounds: No murmur heard.   Pulmonary:     Effort: Pulmonary effort is normal.     Breath sounds: No stridor. No wheezing, rhonchi or rales.  Abdominal:     General: Abdomen is flat. Bowel sounds are normal. There is no distension.     Palpations: Abdomen is soft. There is no hepatomegaly, splenomegaly or mass.     Tenderness: There is no abdominal tenderness.  Musculoskeletal:        General: Normal range of motion.     Cervical back: Neck supple.     Right lower leg: No edema.  Left lower leg: No edema.  Lymphadenopathy:     Cervical: No cervical adenopathy.  Skin:    General: Skin is warm and dry.  Neurological:     General: No focal deficit present.     Mental Status: She is alert.  Psychiatric:        Mood and Affect: Mood normal.        Behavior: Behavior normal.     Lab Results  Component Value Date   WBC 6.1 03/07/2020   HGB 15.1 03/07/2020   HCT 45.4 (H) 03/07/2020   PLT 276 03/07/2020   GLUCOSE 86 03/07/2020   CHOL 205 (H) 03/07/2020   TRIG 114 03/07/2020   HDL 73 03/07/2020   LDLCALC 110 (H)  03/07/2020   ALT 11 03/07/2020   AST 14 03/07/2020   NA 137 03/07/2020   K 3.8 03/07/2020   CL 100 03/07/2020   CREATININE 0.93 03/07/2020   BUN 10 03/07/2020   CO2 28 03/07/2020   TSH 1.72 03/01/2019    US PELVIC COMPLETE WITH TRANSVAGINAL  Result Date: 05/13/2017 CLINICAL DATA:  Follow-up ovarian cyst, endometriosis EXAM: TRANSABDOMINAL AND TRANSVAGINAL ULTRASOUND OF PELVIS TECHNIQUE: Both transabdominal and transvaginal ultrasound examinations of the pelvis were performed. Transabdominal technique was performed for global imaging of the pelvis including uterus, ovaries, adnexal regions, and pelvic cul-de-sac. It was necessary to proceed with endovaginal exam following the transabdominal exam to visualize the endometrium and bilateral ovaries. COMPARISON:  MR pelvis dated 03/09/2017. Pelvic ultrasound dated 01/30/2017. FINDINGS: Uterus Measurements: 8.9 x 5.9 x 6.5 cm. No fibroids or other mass visualized. Endometrium Thickness: 9 mm.  No focal abnormality visualized. Right ovary Measurements: 4.1 x 1.7 x 1.9 cm. Prior dominant 6.7 cm cyst/follicle has resolved. Currently, a dominant 1.6 cm follicle is present, physiologic Left ovary Measurements: 4.9 x 2.4 x 2.5 cm. Dominant 3.1 x 2.2 x 2.5 cm cyst/follicle, likely physiologic. Other findings No abnormal free fluid. IMPRESSION: Prior dominant 6.7 cm right ovarian cyst/follicle has resolved. Dominant 3.1 cm left ovarian cyst/follicle on the current study is likely physiologic. Negative pelvic ultrasound. Electronically Signed   By: Julian Hy M.D.   On: 05/13/2017 14:52    Assessment & Plan:   Michele Mills was seen today for annual exam and hypertension.  Diagnoses and all orders for this visit:  Routine general medical examination at a health care facility- Exam completed, labs reviewed, vaccines reviewed and updated, cancer screenings are up-to-date, patient education material was given. -     Lipid panel; Future -     Hepatitis C  antibody; Future -     Hepatitis C antibody -     Lipid panel  Essential hypertension- Her blood pressure is well controlled.  Electrolytes and renal function are normal. -     hydrochlorothiazide (MICROZIDE) 12.5 MG capsule; Take 1 capsule (12.5 mg total) by mouth daily. -     nebivolol (BYSTOLIC) 5 MG tablet; Take 1 tablet (5 mg total) by mouth daily. -     CBC with Differential/Platelet; Future -     BASIC METABOLIC PANEL WITH GFR; Future -     Hepatic function panel; Future -     Hepatic function panel -     BASIC METABOLIC PANEL WITH GFR -     CBC with Differential/Platelet  Need for hepatitis C screening test -     Hepatitis C antibody; Future -     Hepatitis C antibody  Other orders -     Flu  Vaccine QUAD 6+ mos PF IM (Fluarix Quad PF)   I am having Michele Mills maintain her levocetirizine, sulfamethoxazole-trimethoprim, hydrochlorothiazide, and nebivolol.  Meds ordered this encounter  Medications  . hydrochlorothiazide (MICROZIDE) 12.5 MG capsule    Sig: Take 1 capsule (12.5 mg total) by mouth daily.    Dispense:  90 capsule    Refill:  1  . nebivolol (BYSTOLIC) 5 MG tablet    Sig: Take 1 tablet (5 mg total) by mouth daily.    Dispense:  90 tablet    Refill:  1     Follow-up: Return in about 6 months (around 09/04/2020).  Michele Calico, MD

## 2020-03-07 NOTE — Patient Instructions (Signed)
Health Maintenance, Female Adopting a healthy lifestyle and getting preventive care are important in promoting health and wellness. Ask your health care provider about:  The right schedule for you to have regular tests and exams.  Things you can do on your own to prevent diseases and keep yourself healthy. What should I know about diet, weight, and exercise? Eat a healthy diet   Eat a diet that includes plenty of vegetables, fruits, low-fat dairy products, and lean protein.  Do not eat a lot of foods that are high in solid fats, added sugars, or sodium. Maintain a healthy weight Body mass index (BMI) is used to identify weight problems. It estimates body fat based on height and weight. Your health care provider can help determine your BMI and help you achieve or maintain a healthy weight. Get regular exercise Get regular exercise. This is one of the most important things you can do for your health. Most adults should:  Exercise for at least 150 minutes each week. The exercise should increase your heart rate and make you sweat (moderate-intensity exercise).  Do strengthening exercises at least twice a week. This is in addition to the moderate-intensity exercise.  Spend less time sitting. Even light physical activity can be beneficial. Watch cholesterol and blood lipids Have your blood tested for lipids and cholesterol at 43 years of age, then have this test every 5 years. Have your cholesterol levels checked more often if:  Your lipid or cholesterol levels are high.  You are older than 43 years of age.  You are at high risk for heart disease. What should I know about cancer screening? Depending on your health history and family history, you may need to have cancer screening at various ages. This may include screening for:  Breast cancer.  Cervical cancer.  Colorectal cancer.  Skin cancer.  Lung cancer. What should I know about heart disease, diabetes, and high blood  pressure? Blood pressure and heart disease  High blood pressure causes heart disease and increases the risk of stroke. This is more likely to develop in people who have high blood pressure readings, are of African descent, or are overweight.  Have your blood pressure checked: ? Every 3-5 years if you are 18-39 years of age. ? Every year if you are 40 years old or older. Diabetes Have regular diabetes screenings. This checks your fasting blood sugar level. Have the screening done:  Once every three years after age 40 if you are at a normal weight and have a low risk for diabetes.  More often and at a younger age if you are overweight or have a high risk for diabetes. What should I know about preventing infection? Hepatitis B If you have a higher risk for hepatitis B, you should be screened for this virus. Talk with your health care provider to find out if you are at risk for hepatitis B infection. Hepatitis C Testing is recommended for:  Everyone born from 1945 through 1965.  Anyone with known risk factors for hepatitis C. Sexually transmitted infections (STIs)  Get screened for STIs, including gonorrhea and chlamydia, if: ? You are sexually active and are younger than 43 years of age. ? You are older than 43 years of age and your health care provider tells you that you are at risk for this type of infection. ? Your sexual activity has changed since you were last screened, and you are at increased risk for chlamydia or gonorrhea. Ask your health care provider if   you are at risk.  Ask your health care provider about whether you are at high risk for HIV. Your health care provider may recommend a prescription medicine to help prevent HIV infection. If you choose to take medicine to prevent HIV, you should first get tested for HIV. You should then be tested every 3 months for as long as you are taking the medicine. Pregnancy  If you are about to stop having your period (premenopausal) and  you may become pregnant, seek counseling before you get pregnant.  Take 400 to 800 micrograms (mcg) of folic acid every day if you become pregnant.  Ask for birth control (contraception) if you want to prevent pregnancy. Osteoporosis and menopause Osteoporosis is a disease in which the bones lose minerals and strength with aging. This can result in bone fractures. If you are 65 years old or older, or if you are at risk for osteoporosis and fractures, ask your health care provider if you should:  Be screened for bone loss.  Take a calcium or vitamin D supplement to lower your risk of fractures.  Be given hormone replacement therapy (HRT) to treat symptoms of menopause. Follow these instructions at home: Lifestyle  Do not use any products that contain nicotine or tobacco, such as cigarettes, e-cigarettes, and chewing tobacco. If you need help quitting, ask your health care provider.  Do not use street drugs.  Do not share needles.  Ask your health care provider for help if you need support or information about quitting drugs. Alcohol use  Do not drink alcohol if: ? Your health care provider tells you not to drink. ? You are pregnant, may be pregnant, or are planning to become pregnant.  If you drink alcohol: ? Limit how much you use to 0-1 drink a day. ? Limit intake if you are breastfeeding.  Be aware of how much alcohol is in your drink. In the [place] one drink equals one 12 oz bottle of beer (355 mL), one 5 oz glass of wine (148 mL), or one 1 oz glass of hard liquor (44 mL). General instructions  Schedule regular health, dental, and eye exams.  Stay current with your vaccines.  Tell your health care provider if: ? You often feel depressed. ? You have ever been abused or do not feel safe at home. Summary  Adopting a healthy lifestyle and getting preventive care are important in promoting health and wellness.  Follow your health care provider's instructions about healthy  diet, exercising, and getting tested or screened for diseases.  Follow your health care provider's instructions on monitoring your cholesterol and blood pressure. This information is not intended to replace advice given to you by your health care provider. Make sure you discuss any questions you have with your health care provider. Document Revised: 06/09/2018 Document Reviewed: 06/09/2018 Elsevier Patient Education  2020 [employer]  

## 2020-03-08 LAB — LIPID PANEL
Cholesterol: 205 mg/dL — ABNORMAL HIGH (ref <200)
HDL: 73 mg/dL (ref >=50)
LDL Cholesterol (Calc): 110 mg/dL (calc) — ABNORMAL HIGH
Non-HDL Cholesterol (Calc): 132 mg/dL (calc) — ABNORMAL HIGH (ref <130)
Total CHOL/HDL Ratio: 2.8 (calc) (ref <5.0)
Triglycerides: 114 mg/dL (ref <150)

## 2020-03-08 LAB — BASIC METABOLIC PANEL WITH GFR
BUN: 10 mg/dL (ref 7–25)
CO2: 28 mmol/L (ref 20–32)
Calcium: 10 mg/dL (ref 8.6–10.2)
Chloride: 100 mmol/L (ref 98–110)
Creat: 0.93 mg/dL (ref 0.50–1.10)
GFR, Est African American: 87 mL/min/{1.73_m2} (ref >=60)
GFR, Est Non African American: 75 mL/min/{1.73_m2} (ref >=60)
Glucose, Bld: 86 mg/dL (ref 65–99)
Potassium: 3.8 mmol/L (ref 3.5–5.3)
Sodium: 137 mmol/L (ref 135–146)

## 2020-03-08 LAB — HEPATIC FUNCTION PANEL
AG Ratio: 1.8 (calc) (ref 1.0–2.5)
ALT: 11 U/L (ref 6–29)
AST: 14 U/L (ref 10–30)
Albumin: 4.8 g/dL (ref 3.6–5.1)
Alkaline phosphatase (APISO): 39 U/L (ref 31–125)
Bilirubin, Direct: 0.1 mg/dL (ref 0.0–0.2)
Globulin: 2.6 g/dL (calc) (ref 1.9–3.7)
Indirect Bilirubin: 0.7 mg/dL (calc) (ref 0.2–1.2)
Total Bilirubin: 0.8 mg/dL (ref 0.2–1.2)
Total Protein: 7.4 g/dL (ref 6.1–8.1)

## 2020-03-08 LAB — CBC WITH DIFFERENTIAL/PLATELET
Absolute Monocytes: 519 cells/uL (ref 200–950)
Basophils Absolute: 18 cells/uL (ref 0–200)
Basophils Relative: 0.3 %
Eosinophils Absolute: 79 cells/uL (ref 15–500)
Eosinophils Relative: 1.3 %
HCT: 45.4 % — ABNORMAL HIGH (ref 35.0–45.0)
Hemoglobin: 15.1 g/dL (ref 11.7–15.5)
Lymphs Abs: 2336 cells/uL (ref 850–3900)
MCH: 30.7 pg (ref 27.0–33.0)
MCHC: 33.3 g/dL (ref 32.0–36.0)
MCV: 92.3 fL (ref 80.0–100.0)
MPV: 11 fL (ref 7.5–12.5)
Monocytes Relative: 8.5 %
Neutro Abs: 3148 cells/uL (ref 1500–7800)
Neutrophils Relative %: 51.6 %
Platelets: 276 10*3/uL (ref 140–400)
RBC: 4.92 10*6/uL (ref 3.80–5.10)
RDW: 12.1 % (ref 11.0–15.0)
Total Lymphocyte: 38.3 %
WBC: 6.1 10*3/uL (ref 3.8–10.8)

## 2020-03-08 LAB — HEPATITIS C ANTIBODY
Hepatitis C Ab: NONREACTIVE
SIGNAL TO CUT-OFF: 0.01 (ref <1.00)

## 2020-03-08 NOTE — Assessment & Plan Note (Signed)
The 10-year ASCVD risk score Mikey Bussing DC Brooke Bonito., et al., 2013) is: 0.6%   Values used to calculate the score:     Age: 44 years     Sex: Female     Is Non-Hispanic African American: No     Diabetic: No     Tobacco smoker: No     Systolic Blood Pressure: 161 mmHg     Is BP treated: Yes     HDL Cholesterol: 73 mg/dL     Total Cholesterol: 205 mg/dL

## 2020-06-06 ENCOUNTER — Other Ambulatory Visit: Payer: Self-pay | Admitting: Internal Medicine

## 2020-06-06 DIAGNOSIS — N94819 Vulvodynia, unspecified: Secondary | ICD-10-CM | POA: Diagnosis not present

## 2020-06-06 DIAGNOSIS — G588 Other specified mononeuropathies: Secondary | ICD-10-CM | POA: Diagnosis not present

## 2020-06-06 DIAGNOSIS — N939 Abnormal uterine and vaginal bleeding, unspecified: Secondary | ICD-10-CM | POA: Diagnosis not present

## 2020-06-06 DIAGNOSIS — I10 Essential (primary) hypertension: Secondary | ICD-10-CM

## 2020-07-06 DIAGNOSIS — M4185 Other forms of scoliosis, thoracolumbar region: Secondary | ICD-10-CM | POA: Diagnosis not present

## 2020-07-06 DIAGNOSIS — M50322 Other cervical disc degeneration at C5-C6 level: Secondary | ICD-10-CM | POA: Diagnosis not present

## 2020-07-06 DIAGNOSIS — M50323 Other cervical disc degeneration at C6-C7 level: Secondary | ICD-10-CM | POA: Diagnosis not present

## 2020-07-06 DIAGNOSIS — M47812 Spondylosis without myelopathy or radiculopathy, cervical region: Secondary | ICD-10-CM | POA: Diagnosis not present

## 2020-07-13 DIAGNOSIS — M4185 Other forms of scoliosis, thoracolumbar region: Secondary | ICD-10-CM | POA: Diagnosis not present

## 2020-07-13 DIAGNOSIS — M50322 Other cervical disc degeneration at C5-C6 level: Secondary | ICD-10-CM | POA: Diagnosis not present

## 2020-07-13 DIAGNOSIS — M50323 Other cervical disc degeneration at C6-C7 level: Secondary | ICD-10-CM | POA: Diagnosis not present

## 2020-07-13 DIAGNOSIS — M47812 Spondylosis without myelopathy or radiculopathy, cervical region: Secondary | ICD-10-CM | POA: Diagnosis not present

## 2020-07-18 ENCOUNTER — Other Ambulatory Visit: Payer: Self-pay | Admitting: Internal Medicine

## 2020-07-18 DIAGNOSIS — I10 Essential (primary) hypertension: Secondary | ICD-10-CM

## 2020-07-18 MED ORDER — NEBIVOLOL HCL 5 MG PO TABS: 5.0000 mg | ORAL_TABLET | Freq: Every day | ORAL | 0 refills | Status: DC

## 2020-08-16 DIAGNOSIS — M50322 Other cervical disc degeneration at C5-C6 level: Secondary | ICD-10-CM | POA: Diagnosis not present

## 2020-08-16 DIAGNOSIS — N950 Postmenopausal bleeding: Secondary | ICD-10-CM | POA: Diagnosis not present

## 2020-08-16 DIAGNOSIS — M47812 Spondylosis without myelopathy or radiculopathy, cervical region: Secondary | ICD-10-CM | POA: Diagnosis not present

## 2020-08-16 DIAGNOSIS — I10 Essential (primary) hypertension: Secondary | ICD-10-CM | POA: Diagnosis not present

## 2020-08-16 DIAGNOSIS — M50323 Other cervical disc degeneration at C6-C7 level: Secondary | ICD-10-CM | POA: Diagnosis not present

## 2020-08-16 DIAGNOSIS — N939 Abnormal uterine and vaginal bleeding, unspecified: Secondary | ICD-10-CM | POA: Diagnosis not present

## 2020-08-16 DIAGNOSIS — N903 Dysplasia of vulva, unspecified: Secondary | ICD-10-CM | POA: Diagnosis not present

## 2020-08-16 DIAGNOSIS — M4185 Other forms of scoliosis, thoracolumbar region: Secondary | ICD-10-CM | POA: Diagnosis not present

## 2020-08-16 DIAGNOSIS — Z6822 Body mass index (BMI) 22.0-22.9, adult: Secondary | ICD-10-CM | POA: Diagnosis not present

## 2020-08-21 LAB — HM PAP SMEAR

## 2020-08-31 DIAGNOSIS — M50323 Other cervical disc degeneration at C6-C7 level: Secondary | ICD-10-CM | POA: Diagnosis not present

## 2020-08-31 DIAGNOSIS — M50322 Other cervical disc degeneration at C5-C6 level: Secondary | ICD-10-CM | POA: Diagnosis not present

## 2020-08-31 DIAGNOSIS — M47812 Spondylosis without myelopathy or radiculopathy, cervical region: Secondary | ICD-10-CM | POA: Diagnosis not present

## 2020-08-31 DIAGNOSIS — M4185 Other forms of scoliosis, thoracolumbar region: Secondary | ICD-10-CM | POA: Diagnosis not present

## 2020-09-03 DIAGNOSIS — M47812 Spondylosis without myelopathy or radiculopathy, cervical region: Secondary | ICD-10-CM | POA: Diagnosis not present

## 2020-09-03 DIAGNOSIS — M50323 Other cervical disc degeneration at C6-C7 level: Secondary | ICD-10-CM | POA: Diagnosis not present

## 2020-09-03 DIAGNOSIS — M50322 Other cervical disc degeneration at C5-C6 level: Secondary | ICD-10-CM | POA: Diagnosis not present

## 2020-09-03 DIAGNOSIS — M4185 Other forms of scoliosis, thoracolumbar region: Secondary | ICD-10-CM | POA: Diagnosis not present

## 2020-09-04 ENCOUNTER — Other Ambulatory Visit: Payer: Self-pay

## 2020-09-04 ENCOUNTER — Encounter: Payer: Self-pay | Admitting: Internal Medicine

## 2020-09-04 ENCOUNTER — Ambulatory Visit: Payer: Federal, State, Local not specified - PPO | Admitting: Internal Medicine

## 2020-09-04 VITALS — BP 136/82 | HR 62 | Temp 98.2°F | Resp 16 | Ht 64.0 in | Wt 132.0 lb

## 2020-09-04 DIAGNOSIS — I10 Essential (primary) hypertension: Secondary | ICD-10-CM | POA: Diagnosis not present

## 2020-09-04 LAB — CBC WITH DIFFERENTIAL/PLATELET
Basophils Absolute: 0 10*3/uL (ref 0.0–0.1)
Basophils Relative: 0.4 % (ref 0.0–3.0)
Eosinophils Absolute: 0.1 10*3/uL (ref 0.0–0.7)
Eosinophils Relative: 2.5 % (ref 0.0–5.0)
HCT: 39.4 % (ref 36.0–46.0)
Hemoglobin: 13.5 g/dL (ref 12.0–15.0)
Lymphocytes Relative: 34 % (ref 12.0–46.0)
Lymphs Abs: 1.7 10*3/uL (ref 0.7–4.0)
MCHC: 34.3 g/dL (ref 30.0–36.0)
MCV: 92.7 fl (ref 78.0–100.0)
Monocytes Absolute: 0.4 10*3/uL (ref 0.1–1.0)
Monocytes Relative: 8.7 % (ref 3.0–12.0)
Neutro Abs: 2.7 10*3/uL (ref 1.4–7.7)
Neutrophils Relative %: 54.4 % (ref 43.0–77.0)
Platelets: 294 10*3/uL (ref 150.0–400.0)
RBC: 4.26 Mil/uL (ref 3.87–5.11)
RDW: 12.8 % (ref 11.5–15.5)
WBC: 4.9 10*3/uL (ref 4.0–10.5)

## 2020-09-04 LAB — BASIC METABOLIC PANEL
BUN: 10 mg/dL (ref 6–23)
CO2: 31 mEq/L (ref 19–32)
Calcium: 9.3 mg/dL (ref 8.4–10.5)
Chloride: 101 mEq/L (ref 96–112)
Creatinine, Ser: 0.72 mg/dL (ref 0.40–1.20)
GFR: 102.29 mL/min (ref >60.00)
Glucose, Bld: 86 mg/dL (ref 70–99)
Potassium: 3.7 mEq/L (ref 3.5–5.1)
Sodium: 140 mEq/L (ref 135–145)

## 2020-09-04 NOTE — Progress Notes (Signed)
Subjective:  Patient ID: Michele Mills, female    DOB: [birth year]  Age: 45 y.o. MRN: [id number]  CC: Hypertension  This visit occurred during the SARS-CoV-2 public health emergency.  Safety protocols were in place, including screening questions prior to the visit, additional usage of staff PPE, and extensive cleaning of exam room while observing appropriate contact time as indicated for disinfecting solutions.     HPI Michele Mills presents for f/up - She tells me that her BP has been well controlled. She has felt recently and offers no complaints.  Outpatient Medications Prior to Visit  Medication Sig Dispense Refill  . hydrochlorothiazide (MICROZIDE) 12.5 MG capsule TAKE 1 CAPSULE(12.5 MG) BY MOUTH DAILY 90 capsule 1  . levocetirizine (XYZAL) 5 MG tablet Take 1 tablet (5 mg total) by mouth every evening. 90 tablet 1  . nebivolol (BYSTOLIC) 5 MG tablet Take 1 tablet (5 mg total) by mouth daily. 90 tablet 0  . sulfamethoxazole-trimethoprim (BACTRIM DS) 800-160 MG tablet Take 1 tablet by mouth 2 (two) times daily.     No facility-administered medications prior to visit.    ROS Review of Systems  Constitutional: Negative for diaphoresis, fatigue and unexpected weight change.  HENT: Negative.  Negative for trouble swallowing.   Eyes: Negative for visual disturbance.  Respiratory: Negative for cough, chest tightness, shortness of breath and wheezing.   Cardiovascular: Negative for chest pain, palpitations and leg swelling.  Gastrointestinal: Negative for abdominal pain, diarrhea, nausea and vomiting.  Endocrine: Negative.   Genitourinary: Negative.   Musculoskeletal: Negative.  Negative for arthralgias and myalgias.  Skin: Negative for color change and rash.  Neurological: Negative.  Negative for dizziness, weakness and light-headedness.  Hematological: Negative for adenopathy. Does not bruise/bleed easily.  Psychiatric/Behavioral: Negative.     Objective:  BP 136/82   Pulse 62    Temp 98.2 F (36.8 C) (Oral)   Resp 16   Ht 5\' 4"  (1.626 m)   Wt 132 lb (59.9 kg)   SpO2 99%   BMI 22.66 kg/m   BP Readings from Last 3 Encounters:  09/04/20 136/82  03/07/20 126/86  09/14/19 134/84    Wt Readings from Last 3 Encounters:  09/04/20 132 lb (59.9 kg)  03/07/20 129 lb (58.5 kg)  09/14/19 131 lb (59.4 kg)    Physical Exam Vitals reviewed.  HENT:     Nose: Nose normal.     Mouth/Throat:     Mouth: Mucous membranes are moist.  Eyes:     General: No scleral icterus.    Conjunctiva/sclera: Conjunctivae normal.  Cardiovascular:     Rate and Rhythm: Normal rate and regular rhythm.     Heart sounds: No murmur heard.   Pulmonary:     Effort: Pulmonary effort is normal.     Breath sounds: No stridor. No wheezing, rhonchi or rales.  Abdominal:     General: Abdomen is flat. Bowel sounds are normal.     Palpations: Abdomen is soft.     Tenderness: There is no abdominal tenderness.  Musculoskeletal:        General: Normal range of motion.     Cervical back: Neck supple.     Right lower leg: No edema.     Left lower leg: No edema.  Lymphadenopathy:     Cervical: No cervical adenopathy.  Skin:    General: Skin is warm and dry.  Neurological:     General: No focal deficit present.     Mental Status: She is alert.  Psychiatric:        Mood and Affect: Mood normal.        Behavior: Behavior normal.     Lab Results  Component Value Date   WBC 4.9 09/04/2020   HGB 13.5 09/04/2020   HCT 39.4 09/04/2020   PLT 294.0 09/04/2020   GLUCOSE 86 09/04/2020   CHOL 205 (H) 03/07/2020   TRIG 114 03/07/2020   HDL 73 03/07/2020   LDLCALC 110 (H) 03/07/2020   ALT 11 03/07/2020   AST 14 03/07/2020   NA 140 09/04/2020   K 3.7 09/04/2020   CL 101 09/04/2020   CREATININE 0.72 09/04/2020   BUN 10 09/04/2020   CO2 31 09/04/2020   TSH 1.72 03/01/2019    US PELVIC COMPLETE WITH TRANSVAGINAL  Result Date: 05/13/2017 CLINICAL DATA:  Follow-up ovarian cyst,  endometriosis EXAM: TRANSABDOMINAL AND TRANSVAGINAL ULTRASOUND OF PELVIS TECHNIQUE: Both transabdominal and transvaginal ultrasound examinations of the pelvis were performed. Transabdominal technique was performed for global imaging of the pelvis including uterus, ovaries, adnexal regions, and pelvic cul-de-sac. It was necessary to proceed with endovaginal exam following the transabdominal exam to visualize the endometrium and bilateral ovaries. COMPARISON:  MR pelvis dated 03/09/2017. Pelvic ultrasound dated 01/30/2017. FINDINGS: Uterus Measurements: 8.9 x 5.9 x 6.5 cm. No fibroids or other mass visualized. Endometrium Thickness: 9 mm.  No focal abnormality visualized. Right ovary Measurements: 4.1 x 1.7 x 1.9 cm. Prior dominant 6.7 cm cyst/follicle has resolved. Currently, a dominant 1.6 cm follicle is present, physiologic Left ovary Measurements: 4.9 x 2.4 x 2.5 cm. Dominant 3.1 x 2.2 x 2.5 cm cyst/follicle, likely physiologic. Other findings No abnormal free fluid. IMPRESSION: Prior dominant 6.7 cm right ovarian cyst/follicle has resolved. Dominant 3.1 cm left ovarian cyst/follicle on the current study is likely physiologic. Negative pelvic ultrasound. Electronically Signed   By: Julian Hy M.D.   On: 05/13/2017 14:52    Assessment & Plan:   Michele Mills was seen today for hypertension.  Diagnoses and all orders for this visit:  Primary hypertension- Her BP is well controlled. Lytes and renal fxn are normal. Will continue the current regimen. -     Basic metabolic panel; Future -     CBC with Differential/Platelet; Future -     CBC with Differential/Platelet -     Basic metabolic panel   I am having Michele Mills maintain her levocetirizine, sulfamethoxazole-trimethoprim, hydrochlorothiazide, and nebivolol.  No orders of the defined types were placed in this encounter.    Follow-up: Return in about 6 months (around 03/07/2021).  Michele Calico, MD

## 2020-09-04 NOTE — Patient Instructions (Signed)

## 2020-09-05 DIAGNOSIS — N83201 Unspecified ovarian cyst, right side: Secondary | ICD-10-CM | POA: Diagnosis not present

## 2020-09-05 DIAGNOSIS — G588 Other specified mononeuropathies: Secondary | ICD-10-CM | POA: Diagnosis not present

## 2020-09-05 DIAGNOSIS — N94819 Vulvodynia, unspecified: Secondary | ICD-10-CM | POA: Diagnosis not present

## 2021-01-16 ENCOUNTER — Other Ambulatory Visit: Payer: Self-pay | Admitting: Internal Medicine

## 2021-01-16 DIAGNOSIS — I10 Essential (primary) hypertension: Secondary | ICD-10-CM

## 2021-03-26 LAB — HM MAMMOGRAPHY

## 2021-04-22 ENCOUNTER — Telehealth: Payer: Self-pay | Admitting: Internal Medicine

## 2021-04-22 ENCOUNTER — Other Ambulatory Visit: Payer: Self-pay | Admitting: Internal Medicine

## 2021-04-22 DIAGNOSIS — I10 Essential (primary) hypertension: Secondary | ICD-10-CM

## 2021-04-22 MED ORDER — HYDROCHLOROTHIAZIDE 12.5 MG PO CAPS: ORAL_CAPSULE | ORAL | 0 refills | Status: DC

## 2021-04-22 MED ORDER — NEBIVOLOL HCL 5 MG PO TABS: ORAL_TABLET | ORAL | 0 refills | Status: DC

## 2021-04-22 NOTE — Telephone Encounter (Signed)
1.Medication Requested: hydrochlorothiazide (MICROZIDE) 12.5 MG capsule nebivolol (BYSTOLIC) 5 MG tablet  2. Pharmacy (Name, Perkins):  [facility] [place], [place] [facility] [place] Phone:  [phone]  Fax:  [phone]      3. On Med List: Y  4. Last Visit with PCP: 3.8.2022  5. Next visit date with PCP: 11.15.2022   Agent: Please be advised that RX refills may take up to 3 business days. We ask that you follow-up with your pharmacy.

## 2021-04-23 ENCOUNTER — Ambulatory Visit: Payer: Federal, State, Local not specified - PPO | Admitting: Internal Medicine

## 2021-05-14 ENCOUNTER — Encounter: Payer: Self-pay | Admitting: Internal Medicine

## 2021-05-14 ENCOUNTER — Other Ambulatory Visit: Payer: Self-pay

## 2021-05-14 ENCOUNTER — Ambulatory Visit: Payer: Federal, State, Local not specified - PPO | Admitting: Internal Medicine

## 2021-05-14 VITALS — BP 132/86 | HR 68 | Temp 98.7°F | Ht 64.0 in | Wt 122.0 lb

## 2021-05-14 DIAGNOSIS — K2100 Gastro-esophageal reflux disease with esophagitis, without bleeding: Secondary | ICD-10-CM

## 2021-05-14 DIAGNOSIS — Z0000 Encounter for general adult medical examination without abnormal findings: Secondary | ICD-10-CM

## 2021-05-14 DIAGNOSIS — Z23 Encounter for immunization: Secondary | ICD-10-CM

## 2021-05-14 DIAGNOSIS — I10 Essential (primary) hypertension: Secondary | ICD-10-CM

## 2021-05-14 LAB — BASIC METABOLIC PANEL
BUN: 17 mg/dL (ref 6–23)
CO2: 29 mEq/L (ref 19–32)
Calcium: 9.4 mg/dL (ref 8.4–10.5)
Chloride: 101 mEq/L (ref 96–112)
Creatinine, Ser: 0.74 mg/dL (ref 0.40–1.20)
GFR: 98.5 mL/min (ref >60.00)
Glucose, Bld: 83 mg/dL (ref 70–99)
Potassium: 4 mEq/L (ref 3.5–5.1)
Sodium: 137 mEq/L (ref 135–145)

## 2021-05-14 LAB — LIPID PANEL
Cholesterol: 188 mg/dL (ref 0–200)
HDL: 75.8 mg/dL (ref >39.00)
LDL Cholesterol: 77 mg/dL (ref 0–99)
NonHDL: 112.04
Total CHOL/HDL Ratio: 2
Triglycerides: 173 mg/dL — ABNORMAL HIGH (ref 0.0–149.0)
VLDL: 34.6 mg/dL (ref 0.0–40.0)

## 2021-05-14 LAB — HEPATIC FUNCTION PANEL
ALT: 16 U/L (ref 0–35)
AST: 17 U/L (ref 0–37)
Albumin: 4.4 g/dL (ref 3.5–5.2)
Alkaline Phosphatase: 35 U/L — ABNORMAL LOW (ref 39–117)
Bilirubin, Direct: 0.1 mg/dL (ref 0.0–0.3)
Total Bilirubin: 0.6 mg/dL (ref 0.2–1.2)
Total Protein: 6.9 g/dL (ref 6.0–8.3)

## 2021-05-14 LAB — CBC WITH DIFFERENTIAL/PLATELET
Basophils Absolute: 0 10*3/uL (ref 0.0–0.1)
Basophils Relative: 0.4 % (ref 0.0–3.0)
Eosinophils Absolute: 0 10*3/uL (ref 0.0–0.7)
Eosinophils Relative: 0.5 % (ref 0.0–5.0)
HCT: 40.8 % (ref 36.0–46.0)
Hemoglobin: 13.6 g/dL (ref 12.0–15.0)
Lymphocytes Relative: 24.6 % (ref 12.0–46.0)
Lymphs Abs: 1.8 10*3/uL (ref 0.7–4.0)
MCHC: 33.3 g/dL (ref 30.0–36.0)
MCV: 93 fl (ref 78.0–100.0)
Monocytes Absolute: 0.4 10*3/uL (ref 0.1–1.0)
Monocytes Relative: 5.8 % (ref 3.0–12.0)
Neutro Abs: 5 10*3/uL (ref 1.4–7.7)
Neutrophils Relative %: 68.7 % (ref 43.0–77.0)
Platelets: 271 10*3/uL (ref 150.0–400.0)
RBC: 4.39 Mil/uL (ref 3.87–5.11)
RDW: 12.8 % (ref 11.5–15.5)
WBC: 7.2 10*3/uL (ref 4.0–10.5)

## 2021-05-14 LAB — TSH: TSH: 2.32 u[IU]/mL (ref 0.35–5.50)

## 2021-05-14 NOTE — Patient Instructions (Signed)

## 2021-05-14 NOTE — Progress Notes (Signed)
Subjective:  Patient ID: Michele Mills, female    DOB: 1975-10-06  Age: 45 y.o. MRN: [id number]  CC: Annual Exam and Hypertension  This visit occurred during the SARS-CoV-2 public health emergency.  Safety protocols were in place, including screening questions prior to the visit, additional usage of staff PPE, and extensive cleaning of exam room while observing appropriate contact time as indicated for disinfecting solutions.    HPI Michele Mills presents for a CPX and f/up -  She tells me her blood pressure is well controlled.  She denies dizziness, lightheadedness, chest pain, shortness of breath, edema, or near syncope.  Outpatient Medications Prior to Visit  Medication Sig Dispense Refill   hydrochlorothiazide (MICROZIDE) 12.5 MG capsule TAKE 1 CAPSULE(12.5 MG) BY MOUTH DAILY 90 capsule 0   levocetirizine (XYZAL) 5 MG tablet Take 1 tablet (5 mg total) by mouth every evening. 90 tablet 1   nebivolol (BYSTOLIC) 5 MG tablet TAKE 1 TABLET(5 MG) BY MOUTH DAILY 90 tablet 0   No facility-administered medications prior to visit.    ROS Review of Systems  Constitutional:  Negative for diaphoresis, fatigue and unexpected weight change.  HENT: Negative.    Eyes: Negative.   Respiratory:  Negative for cough, chest tightness, shortness of breath and wheezing.   Cardiovascular:  Negative for chest pain, palpitations and leg swelling.  Gastrointestinal:  Negative for abdominal pain, constipation, diarrhea and nausea.  Endocrine: Negative.   Genitourinary: Negative.   Musculoskeletal: Negative.   Skin: Negative.  Negative for color change.  Neurological:  Negative for dizziness, weakness and light-headedness.  Hematological:  Negative for adenopathy. Does not bruise/bleed easily.  Psychiatric/Behavioral: Negative.     Objective:  BP 132/86 (BP Location: Right Arm, Patient Position: Sitting, Cuff Size: Large)   Pulse 68   Temp 98.7 F (37.1 C) (Oral)   Ht 5\' 4"  (1.626 m)   Wt 122 lb  (55.3 kg)   SpO2 99%   BMI 20.94 kg/m   BP Readings from Last 3 Encounters:  05/14/21 132/86  09/04/20 136/82  03/07/20 126/86    Wt Readings from Last 3 Encounters:  05/14/21 122 lb (55.3 kg)  09/04/20 132 lb (59.9 kg)  03/07/20 129 lb (58.5 kg)    Physical Exam Vitals reviewed.  Constitutional:      Appearance: Normal appearance.  HENT:     Nose: Nose normal.     Mouth/Throat:     Mouth: Mucous membranes are moist.  Eyes:     General: No scleral icterus.    Conjunctiva/sclera: Conjunctivae normal.  Cardiovascular:     Rate and Rhythm: Normal rate and regular rhythm.     Heart sounds: No murmur heard. Pulmonary:     Effort: Pulmonary effort is normal.     Breath sounds: No stridor. No wheezing, rhonchi or rales.  Abdominal:     General: Abdomen is flat.     Palpations: There is no mass.     Tenderness: There is no abdominal tenderness. There is no guarding.     Hernia: No hernia is present.  Musculoskeletal:        General: Normal range of motion.     Cervical back: Neck supple.     Right lower leg: No edema.     Left lower leg: No edema.  Lymphadenopathy:     Cervical: No cervical adenopathy.  Skin:    General: Skin is warm and dry.     Coloration: Skin is not pale.  Neurological:  General: No focal deficit present.     Mental Status: She is alert.  Psychiatric:        Mood and Affect: Mood normal.        Behavior: Behavior normal.    Lab Results  Component Value Date   WBC 7.2 05/14/2021   HGB 13.6 05/14/2021   HCT 40.8 05/14/2021   PLT 271.0 05/14/2021   GLUCOSE 83 05/14/2021   CHOL 188 05/14/2021   TRIG 173.0 (H) 05/14/2021   HDL 75.80 05/14/2021   LDLCALC 77 05/14/2021   ALT 16 05/14/2021   AST 17 05/14/2021   NA 137 05/14/2021   K 4.0 05/14/2021   CL 101 05/14/2021   CREATININE 0.74 05/14/2021   BUN 17 05/14/2021   CO2 29 05/14/2021   TSH 2.32 05/14/2021    US PELVIC COMPLETE WITH TRANSVAGINAL  Result Date:  05/13/2017 CLINICAL DATA:  Follow-up ovarian cyst, endometriosis EXAM: TRANSABDOMINAL AND TRANSVAGINAL ULTRASOUND OF PELVIS TECHNIQUE: Both transabdominal and transvaginal ultrasound examinations of the pelvis were performed. Transabdominal technique was performed for global imaging of the pelvis including uterus, ovaries, adnexal regions, and pelvic cul-de-sac. It was necessary to proceed with endovaginal exam following the transabdominal exam to visualize the endometrium and bilateral ovaries. COMPARISON:  MR pelvis dated 03/09/2017. Pelvic ultrasound dated 01/30/2017. FINDINGS: Uterus Measurements: 8.9 x 5.9 x 6.5 cm. No fibroids or other mass visualized. Endometrium Thickness: 9 mm.  No focal abnormality visualized. Right ovary Measurements: 4.1 x 1.7 x 1.9 cm. Prior dominant 6.7 cm cyst/follicle has resolved. Currently, a dominant 1.6 cm follicle is present, physiologic Left ovary Measurements: 4.9 x 2.4 x 2.5 cm. Dominant 3.1 x 2.2 x 2.5 cm cyst/follicle, likely physiologic. Other findings No abnormal free fluid. IMPRESSION: Prior dominant 6.7 cm right ovarian cyst/follicle has resolved. Dominant 3.1 cm left ovarian cyst/follicle on the current study is likely physiologic. Negative pelvic ultrasound. Electronically Signed   By: Julian Hy M.D.   On: 05/13/2017 14:52    Assessment & Plan:   Joyceline was seen today for annual exam and hypertension.  Diagnoses and all orders for this visit:  Primary hypertension- Her blood pressure is adequately well controlled.  Electrolytes and renal function are normal.  Will continue the combination of hydrochlorothiazide and nebivolol. -     CBC with Differential/Platelet; Future -     Basic metabolic panel; Future -     TSH; Future -     Hepatic function panel; Future -     Hepatic function panel -     TSH -     Basic metabolic panel -     CBC with Differential/Platelet  Gastroesophageal reflux disease with esophagitis without hemorrhage- She is  asymptomatic and is not taking an acid reducing medication. -     CBC with Differential/Platelet; Future -     Hepatic function panel; Future -     Hepatic function panel -     CBC with Differential/Platelet  Routine general medical examination at a health care facility- Exam completed, labs reviewed-statin therapy is not indicated, vaccines reviewed and updated, cancer screenings are up-to-date, patient education was given. -     Lipid panel; Future -     Lipid panel  Other orders -     Flu Vaccine QUAD 6+ mos PF IM (Fluarix Quad PF)  I am having Ashani Buss maintain her levocetirizine, hydrochlorothiazide, and nebivolol.  No orders of the defined types were placed in this encounter.    Follow-up: Return in  about 6 months (around 11/11/2021).  [name], MD

## 2021-10-10 ENCOUNTER — Other Ambulatory Visit: Payer: Self-pay | Admitting: Internal Medicine

## 2021-10-10 DIAGNOSIS — I10 Essential (primary) hypertension: Secondary | ICD-10-CM

## 2021-12-18 ENCOUNTER — Ambulatory Visit: Payer: Federal, State, Local not specified - PPO | Admitting: Internal Medicine

## 2022-02-19 ENCOUNTER — Other Ambulatory Visit: Payer: Self-pay | Admitting: Internal Medicine

## 2022-02-19 DIAGNOSIS — I10 Essential (primary) hypertension: Secondary | ICD-10-CM

## 2022-03-26 ENCOUNTER — Ambulatory Visit: Payer: Federal, State, Local not specified - PPO | Admitting: Internal Medicine

## 2022-04-07 LAB — HM MAMMOGRAPHY

## 2022-04-16 LAB — HM PAP SMEAR: HM Pap smear: NEGATIVE

## 2022-04-22 ENCOUNTER — Ambulatory Visit: Payer: Federal, State, Local not specified - PPO | Admitting: Internal Medicine

## 2022-04-23 ENCOUNTER — Encounter: Payer: Self-pay | Admitting: Internal Medicine

## 2022-04-23 ENCOUNTER — Ambulatory Visit: Payer: Federal, State, Local not specified - PPO | Admitting: Internal Medicine

## 2022-04-23 VITALS — BP 134/86 | HR 92 | Temp 97.9°F | Resp 16 | Ht 64.0 in | Wt 127.0 lb

## 2022-04-23 DIAGNOSIS — E876 Hypokalemia: Secondary | ICD-10-CM

## 2022-04-23 DIAGNOSIS — R768 Other specified abnormal immunological findings in serum: Secondary | ICD-10-CM

## 2022-04-23 DIAGNOSIS — R200 Anesthesia of skin: Secondary | ICD-10-CM | POA: Insufficient documentation

## 2022-04-23 DIAGNOSIS — M7918 Myalgia, other site: Secondary | ICD-10-CM | POA: Diagnosis not present

## 2022-04-23 DIAGNOSIS — T502X5A Adverse effect of carbonic-anhydrase inhibitors, benzothiadiazides and other diuretics, initial encounter: Secondary | ICD-10-CM

## 2022-04-23 DIAGNOSIS — I739 Peripheral vascular disease, unspecified: Secondary | ICD-10-CM | POA: Insufficient documentation

## 2022-04-23 DIAGNOSIS — I7300 Raynaud's syndrome without gangrene: Secondary | ICD-10-CM | POA: Insufficient documentation

## 2022-04-23 DIAGNOSIS — K2100 Gastro-esophageal reflux disease with esophagitis, without bleeding: Secondary | ICD-10-CM

## 2022-04-23 DIAGNOSIS — R202 Paresthesia of skin: Secondary | ICD-10-CM

## 2022-04-23 DIAGNOSIS — I10 Essential (primary) hypertension: Secondary | ICD-10-CM | POA: Diagnosis not present

## 2022-04-23 LAB — CBC WITH DIFFERENTIAL/PLATELET
Basophils Absolute: 0 10*3/uL (ref 0.0–0.1)
Basophils Relative: 0.3 % (ref 0.0–3.0)
Eosinophils Absolute: 0.2 10*3/uL (ref 0.0–0.7)
Eosinophils Relative: 2.9 % (ref 0.0–5.0)
HCT: 40.9 % (ref 36.0–46.0)
Hemoglobin: 13.6 g/dL (ref 12.0–15.0)
Lymphocytes Relative: 30.4 % (ref 12.0–46.0)
Lymphs Abs: 2 10*3/uL (ref 0.7–4.0)
MCHC: 33.2 g/dL (ref 30.0–36.0)
MCV: 91.1 fl (ref 78.0–100.0)
Monocytes Absolute: 0.4 10*3/uL (ref 0.1–1.0)
Monocytes Relative: 6.1 % (ref 3.0–12.0)
Neutro Abs: 3.9 10*3/uL (ref 1.4–7.7)
Neutrophils Relative %: 60.3 % (ref 43.0–77.0)
Platelets: 368 10*3/uL (ref 150.0–400.0)
RBC: 4.49 Mil/uL (ref 3.87–5.11)
RDW: 13.2 % (ref 11.5–15.5)
WBC: 6.5 10*3/uL (ref 4.0–10.5)

## 2022-04-23 LAB — BASIC METABOLIC PANEL
BUN: 10 mg/dL (ref 6–23)
CO2: 30 mEq/L (ref 19–32)
Calcium: 9.3 mg/dL (ref 8.4–10.5)
Chloride: 99 mEq/L (ref 96–112)
Creatinine, Ser: 0.62 mg/dL (ref 0.40–1.20)
GFR: 107.7 mL/min (ref >60.00)
Glucose, Bld: 98 mg/dL (ref 70–99)
Potassium: 3.3 mEq/L — ABNORMAL LOW (ref 3.5–5.1)
Sodium: 136 mEq/L (ref 135–145)

## 2022-04-23 LAB — HEPATIC FUNCTION PANEL
ALT: 14 U/L (ref 0–35)
AST: 18 U/L (ref 0–37)
Albumin: 4.4 g/dL (ref 3.5–5.2)
Alkaline Phosphatase: 37 U/L — ABNORMAL LOW (ref 39–117)
Bilirubin, Direct: 0.1 mg/dL (ref 0.0–0.3)
Total Bilirubin: 0.6 mg/dL (ref 0.2–1.2)
Total Protein: 7.1 g/dL (ref 6.0–8.3)

## 2022-04-23 LAB — URINALYSIS, ROUTINE W REFLEX MICROSCOPIC
Bilirubin Urine: NEGATIVE
Hgb urine dipstick: NEGATIVE
Ketones, ur: NEGATIVE
Leukocytes,Ua: NEGATIVE
Nitrite: NEGATIVE
RBC / HPF: NONE SEEN (ref 0–?)
Specific Gravity, Urine: <=1.005 — AB (ref 1.000–1.030)
Total Protein, Urine: NEGATIVE
Urine Glucose: NEGATIVE
Urobilinogen, UA: 0.2 (ref 0.0–1.0)
WBC, UA: NONE SEEN (ref 0–?)
pH: 6 (ref 5.0–8.0)

## 2022-04-23 LAB — FOLATE: Folate: >23.8 ng/mL (ref >5.9)

## 2022-04-23 LAB — TSH: TSH: 1.87 u[IU]/mL (ref 0.35–5.50)

## 2022-04-23 LAB — C-REACTIVE PROTEIN: CRP: <1 mg/dL (ref 0.5–20.0)

## 2022-04-23 LAB — VITAMIN B12: Vitamin B-12: 911 pg/mL (ref 211–911)

## 2022-04-23 LAB — CK: Total CK: 43 U/L (ref 7–177)

## 2022-04-23 NOTE — Progress Notes (Unsigned)
Subjective:  Patient ID: Michele Mills, female    DOB: 1975-08-20  Age: 46 y.o. MRN: [id number]  CC: No chief complaint on file.   HPI Michele Mills presents for ***  Outpatient Medications Prior to Visit  Medication Sig Dispense Refill   hydrochlorothiazide (MICROZIDE) 12.5 MG capsule TAKE 1 CAPSULE(12.5 MG) BY MOUTH DAILY 90 capsule 0   levocetirizine (XYZAL) 5 MG tablet Take 1 tablet (5 mg total) by mouth every evening. 90 tablet 1   nebivolol (BYSTOLIC) 5 MG tablet TAKE 1 TABLET(5 MG) BY MOUTH DAILY 90 tablet 0   No facility-administered medications prior to visit.    ROS Review of Systems  Objective:  BP 134/86 (BP Location: Left Arm, Patient Position: Sitting, Cuff Size: Large)   Pulse 92   Temp 97.9 F (36.6 C) (Oral)   Ht '5\' 4"'$  (1.626 m)   Wt 127 lb (57.6 kg)   SpO2 98%   BMI 21.80 kg/m   BP Readings from Last 3 Encounters:  04/23/22 134/86  05/14/21 132/86  09/04/20 136/82    Wt Readings from Last 3 Encounters:  04/23/22 127 lb (57.6 kg)  05/14/21 122 lb (55.3 kg)  09/04/20 132 lb (59.9 kg)    Physical Exam  Lab Results  Component Value Date   WBC 7.2 05/14/2021   HGB 13.6 05/14/2021   HCT 40.8 05/14/2021   PLT 271.0 05/14/2021   GLUCOSE 83 05/14/2021   CHOL 188 05/14/2021   TRIG 173.0 (H) 05/14/2021   HDL 75.80 05/14/2021   LDLCALC 77 05/14/2021   ALT 16 05/14/2021   AST 17 05/14/2021   NA 137 05/14/2021   K 4.0 05/14/2021   CL 101 05/14/2021   CREATININE 0.74 05/14/2021   BUN 17 05/14/2021   CO2 29 05/14/2021   TSH 2.32 05/14/2021    US PELVIC COMPLETE WITH TRANSVAGINAL  Result Date: 05/13/2017 CLINICAL DATA:  Follow-up ovarian cyst, endometriosis EXAM: TRANSABDOMINAL AND TRANSVAGINAL ULTRASOUND OF PELVIS TECHNIQUE: Both transabdominal and transvaginal ultrasound examinations of the pelvis were performed. Transabdominal technique was performed for global imaging of the pelvis including uterus, ovaries, adnexal regions, and pelvic  cul-de-sac. It was necessary to proceed with endovaginal exam following the transabdominal exam to visualize the endometrium and bilateral ovaries. COMPARISON:  MR pelvis dated 03/09/2017. Pelvic ultrasound dated 01/30/2017. FINDINGS: Uterus Measurements: 8.9 x 5.9 x 6.5 cm. No fibroids or other mass visualized. Endometrium Thickness: 9 mm.  No focal abnormality visualized. Right ovary Measurements: 4.1 x 1.7 x 1.9 cm. Prior dominant 6.7 cm cyst/follicle has resolved. Currently, a dominant 1.6 cm follicle is present, physiologic Left ovary Measurements: 4.9 x 2.4 x 2.5 cm. Dominant 3.1 x 2.2 x 2.5 cm cyst/follicle, likely physiologic. Other findings No abnormal free fluid. IMPRESSION: Prior dominant 6.7 cm right ovarian cyst/follicle has resolved. Dominant 3.1 cm left ovarian cyst/follicle on the current study is likely physiologic. Negative pelvic ultrasound. Electronically Signed   By: Julian Hy M.D.   On: 05/13/2017 14:52    Assessment & Plan:   Diagnoses and all orders for this visit:  Primary hypertension -     Basic metabolic panel; Future -     CBC with Differential/Platelet; Future -     TSH; Future -     Urinalysis, Routine w reflex microscopic; Future  Gastroesophageal reflux disease with esophagitis without hemorrhage -     CBC with Differential/Platelet; Future  Raynaud's disease without gangrene -     ANA; Future -     Rheumatoid  factor; Future  Claudication (HCC)  Numbness or tingling -     Vitamin B12; Future -     TSH; Future -     Folate; Future -     Vitamin B1; Future  Myalgia, lower leg -     Hepatic function panel; Future -     C-reactive protein; Future -     CK; Future -     ANA; Future -     Rheumatoid factor; Future   I am having Michele Mills maintain her levocetirizine, hydrochlorothiazide, and nebivolol.  No orders of the defined types were placed in this encounter.    Follow-up: No follow-ups on file.  [name], MD

## 2022-04-23 NOTE — Patient Instructions (Signed)
Hypertension, Adult High blood pressure (hypertension) is when the force of blood pumping through the arteries is too strong. The arteries are the blood vessels that carry blood from the heart throughout the body. Hypertension forces the heart to work harder to pump blood and may cause arteries to become narrow or stiff. Untreated or uncontrolled hypertension can lead to a heart attack, heart failure, a stroke, kidney disease, and other problems. A blood pressure reading consists of a higher number over a lower number. Ideally, your blood pressure should be below 120/80. The first ("top") number is called the systolic pressure. It is a measure of the pressure in your arteries as your heart beats. The second ("bottom") number is called the diastolic pressure. It is a measure of the pressure in your arteries as the heart relaxes. What are the causes? The exact cause of this condition is not known. There are some conditions that result in high blood pressure. What increases the risk? Certain factors may make you more likely to develop high blood pressure. Some of these risk factors are under your control, including: Smoking. Not getting enough exercise or physical activity. Being overweight. Having too much fat, sugar, calories, or salt (sodium) in your diet. Drinking too much alcohol. Other risk factors include: Having a personal history of heart disease, diabetes, high cholesterol, or kidney disease. Stress. Having a family history of high blood pressure and high cholesterol. Having obstructive sleep apnea. Age. The risk increases with age. What are the signs or symptoms? High blood pressure may not cause symptoms. Very high blood pressure (hypertensive crisis) may cause: Headache. Fast or irregular heartbeats (palpitations). Shortness of breath. Nosebleed. Nausea and vomiting. Vision changes. Severe chest pain, dizziness, and seizures. How is this diagnosed? This condition is diagnosed by  measuring your blood pressure while you are seated, with your arm resting on a flat surface, your legs uncrossed, and your feet flat on the floor. The cuff of the blood pressure monitor will be placed directly against the skin of your upper arm at the level of your heart. Blood pressure should be measured at least twice using the same arm. Certain conditions can cause a difference in blood pressure between your right and left arms. If you have a high blood pressure reading during one visit or you have normal blood pressure with other risk factors, you may be asked to: Return on a different day to have your blood pressure checked again. Monitor your blood pressure at home for 1 week or longer. If you are diagnosed with hypertension, you may have other blood or imaging tests to help your health care provider understand your overall risk for other conditions. How is this treated? This condition is treated by making healthy lifestyle changes, such as eating healthy foods, exercising more, and reducing your alcohol intake. You may be referred for counseling on a healthy diet and physical activity. Your health care provider may prescribe medicine if lifestyle changes are not enough to get your blood pressure under control and if: Your systolic blood pressure is above 130. Your diastolic blood pressure is above 80. Your personal target blood pressure may vary depending on your medical conditions, your age, and other factors. Follow these instructions at home: Eating and drinking  Eat a diet that is high in fiber and potassium, and low in sodium, added sugar, and fat. An example of this eating plan is called the DASH diet. DASH stands for Dietary Approaches to Stop Hypertension. To eat this way: Eat   plenty of fresh fruits and vegetables. Try to fill one half of your plate at each meal with fruits and vegetables. Eat whole grains, such as whole-wheat pasta, brown rice, or whole-grain bread. Fill about one  fourth of your plate with whole grains. Eat or drink low-fat dairy products, such as skim milk or low-fat yogurt. Avoid fatty cuts of meat, processed or cured meats, and poultry with skin. Fill about one fourth of your plate with lean proteins, such as fish, chicken without skin, beans, eggs, or tofu. Avoid pre-made and processed foods. These tend to be higher in sodium, added sugar, and fat. Reduce your daily sodium intake. Many people with hypertension should eat less than 1,500 mg of sodium a day. Do not drink alcohol if: Your health care provider tells you not to drink. You are pregnant, may be pregnant, or are planning to become pregnant. If you drink alcohol: Limit how much you have to: 0-1 drink a day for women. 0-2 drinks a day for men. Know how much alcohol is in your drink. In the [place] one drink equals one 12 oz bottle of beer (355 mL), one 5 oz glass of wine (148 mL), or one 1 oz glass of hard liquor (44 mL). Lifestyle  Work with your health care provider to maintain a healthy body weight or to lose weight. Ask what an ideal weight is for you. Get at least 30 minutes of exercise that causes your heart to beat faster (aerobic exercise) most days of the week. Activities may include walking, swimming, or biking. Include exercise to strengthen your muscles (resistance exercise), such as Pilates or lifting weights, as part of your weekly exercise routine. Try to do these types of exercises for 30 minutes at least 3 days a week. Do not use any products that contain nicotine or tobacco. These products include cigarettes, chewing tobacco, and vaping devices, such as e-cigarettes. If you need help quitting, ask your health care provider. Monitor your blood pressure at home as told by your health care provider. Keep all follow-up visits. This is important. Medicines Take over-the-counter and prescription medicines only as told by your health care provider. Follow directions carefully. Blood  pressure medicines must be taken as prescribed. Do not skip doses of blood pressure medicine. Doing this puts you at risk for problems and can make the medicine less effective. Ask your health care provider about side effects or reactions to medicines that you should watch for. Contact a health care provider if you: Think you are having a reaction to a medicine you are taking. Have headaches that keep coming back (recurring). Feel dizzy. Have swelling in your ankles. Have trouble with your vision. Get help right away if you: Develop a severe headache or confusion. Have unusual weakness or numbness. Feel faint. Have severe pain in your chest or abdomen. Vomit repeatedly. Have trouble breathing. These symptoms may be an emergency. Get help right away. Call 911. Do not wait to see if the symptoms will go away. Do not drive yourself to the hospital. Summary Hypertension is when the force of blood pumping through your arteries is too strong. If this condition is not controlled, it may put you at risk for serious complications. Your personal target blood pressure may vary depending on your medical conditions, your age, and other factors. For most people, a normal blood pressure is less than 120/80. Hypertension is treated with lifestyle changes, medicines, or a combination of both. Lifestyle changes include losing weight, eating a healthy,   low-sodium diet, exercising more, and limiting alcohol. This information is not intended to replace advice given to you by your health care provider. Make sure you discuss any questions you have with your health care provider. Document Revised: 04/23/2021 Document Reviewed: 04/23/2021 Elsevier Patient Education  2023 [employer]  

## 2022-04-24 ENCOUNTER — Other Ambulatory Visit: Payer: Self-pay | Admitting: Internal Medicine

## 2022-04-24 ENCOUNTER — Encounter: Payer: Self-pay | Admitting: Internal Medicine

## 2022-04-24 DIAGNOSIS — E876 Hypokalemia: Secondary | ICD-10-CM | POA: Insufficient documentation

## 2022-04-24 DIAGNOSIS — T502X5A Adverse effect of carbonic-anhydrase inhibitors, benzothiadiazides and other diuretics, initial encounter: Secondary | ICD-10-CM | POA: Insufficient documentation

## 2022-04-24 DIAGNOSIS — I10 Essential (primary) hypertension: Secondary | ICD-10-CM

## 2022-04-24 DIAGNOSIS — M7918 Myalgia, other site: Secondary | ICD-10-CM | POA: Insufficient documentation

## 2022-04-24 MED ORDER — POTASSIUM CHLORIDE CRYS ER 20 MEQ PO TBCR: 20.0000 meq | EXTENDED_RELEASE_TABLET | Freq: Two times a day (BID) | ORAL | 0 refills | Status: DC

## 2022-04-24 MED ORDER — POTASSIUM CHLORIDE CRYS ER 20 MEQ PO TBCR: 20.0000 meq | EXTENDED_RELEASE_TABLET | Freq: Every day | ORAL | 0 refills | Status: DC

## 2022-04-29 DIAGNOSIS — R768 Other specified abnormal immunological findings in serum: Secondary | ICD-10-CM | POA: Insufficient documentation

## 2022-04-29 LAB — ANA: Anti Nuclear Antibody (ANA): POSITIVE — AB

## 2022-04-29 LAB — ANTI-NUCLEAR AB-TITER (ANA TITER): ANA Titer 1: 1:40 {titer} — ABNORMAL HIGH

## 2022-04-29 LAB — VITAMIN B1: Vitamin B1 (Thiamine): 10 nmol/L (ref 8–30)

## 2022-04-29 LAB — RHEUMATOID FACTOR: Rheumatoid fact SerPl-aCnc: 31 IU/mL — ABNORMAL HIGH (ref <14)

## 2022-05-05 ENCOUNTER — Encounter (HOSPITAL_COMMUNITY): Payer: Federal, State, Local not specified - PPO

## 2022-05-14 ENCOUNTER — Ambulatory Visit: Payer: Federal, State, Local not specified - PPO | Admitting: Internal Medicine

## 2022-05-16 ENCOUNTER — Other Ambulatory Visit: Payer: Self-pay | Admitting: Internal Medicine

## 2022-05-16 DIAGNOSIS — I10 Essential (primary) hypertension: Secondary | ICD-10-CM

## 2022-05-20 ENCOUNTER — Ambulatory Visit (HOSPITAL_COMMUNITY)
Admission: RE | Admit: 2022-05-20 | Discharge: 2022-05-20 | Disposition: A | Discharge status: Home / Self Care | Payer: Federal, State, Local not specified - PPO | Source: Ambulatory Visit | Attending: Internal Medicine | Admitting: Internal Medicine

## 2022-05-20 ENCOUNTER — Encounter: Payer: Federal, State, Local not specified - PPO | Admitting: Internal Medicine

## 2022-05-20 DIAGNOSIS — I739 Peripheral vascular disease, unspecified: Secondary | ICD-10-CM | POA: Insufficient documentation

## 2022-07-17 LAB — BASIC METABOLIC PANEL
BUN: 9 (ref 4–21)
CO2: 31 — AB (ref 13–22)
Chloride: 105 (ref 99–108)
Creatinine: 0.7 (ref 0.5–1.1)
Glucose: 107
Potassium: 4 mEq/L (ref 3.5–5.1)
Sodium: 140 (ref 137–147)

## 2022-07-17 LAB — COMPREHENSIVE METABOLIC PANEL
Calcium: 9 (ref 8.7–10.7)
eGFR: 109

## 2022-08-12 ENCOUNTER — Other Ambulatory Visit: Payer: Self-pay | Admitting: Internal Medicine

## 2022-08-12 DIAGNOSIS — I10 Essential (primary) hypertension: Secondary | ICD-10-CM

## 2022-08-26 ENCOUNTER — Encounter: Payer: Federal, State, Local not specified - PPO | Admitting: Internal Medicine

## 2022-11-05 ENCOUNTER — Other Ambulatory Visit: Payer: Self-pay | Admitting: Internal Medicine

## 2022-11-05 DIAGNOSIS — I10 Essential (primary) hypertension: Secondary | ICD-10-CM

## 2022-12-11 ENCOUNTER — Ambulatory Visit: Payer: Federal, State, Local not specified - PPO | Admitting: Internal Medicine

## 2022-12-11 ENCOUNTER — Encounter: Payer: Self-pay | Admitting: Internal Medicine

## 2022-12-11 VITALS — BP 126/84 | HR 82 | Temp 98.0°F | Resp 16 | Ht 64.0 in | Wt 130.0 lb

## 2022-12-11 DIAGNOSIS — I10 Essential (primary) hypertension: Secondary | ICD-10-CM

## 2022-12-11 DIAGNOSIS — Z0001 Encounter for general adult medical examination with abnormal findings: Secondary | ICD-10-CM

## 2022-12-11 DIAGNOSIS — Z1211 Encounter for screening for malignant neoplasm of colon: Secondary | ICD-10-CM

## 2022-12-11 DIAGNOSIS — Z0000 Encounter for general adult medical examination without abnormal findings: Secondary | ICD-10-CM | POA: Diagnosis not present

## 2022-12-11 DIAGNOSIS — Z23 Encounter for immunization: Secondary | ICD-10-CM | POA: Diagnosis not present

## 2022-12-11 MED ORDER — NEBIVOLOL HCL 5 MG PO TABS: 5.0000 mg | ORAL_TABLET | Freq: Every day | ORAL | 1 refills | Status: DC

## 2022-12-11 NOTE — Progress Notes (Unsigned)
Subjective:  Patient ID: Michele Mills, female    DOB: Mar 18, 1976  Age: 47 y.o. MRN: [id number]  CC: Hypertension and Annual Exam   HPI [name] presents for a CPX and f/up    History of Present Illness   Since I last saw her, she has been diagnosed with Raynaud's phenomenon by a rheumatologist at [facility] and has been prescribed amlodipine. This medication has led to a few instances of ankle edema but is otherwise well-tolerated. The patient continues to take nebivolol. She maintains an active lifestyle and denies experiencing chest pain, shortness of breath, palpitations, dizziness, lightheadedness, or edema. She denies joint pain or swelling and is not taking any medication for musculoskeletal pain.      Outpatient Medications Prior to Visit  Medication Sig Dispense Refill   levocetirizine (XYZAL) 5 MG tablet Take 1 tablet (5 mg total) by mouth every evening. 90 tablet 1   potassium chloride SA (KLOR-CON M) 20 MEQ tablet Take 1 tablet (20 mEq total) by mouth daily. 90 tablet 0   nebivolol (BYSTOLIC) 5 MG tablet TAKE 1 TABLET(5 MG) BY MOUTH DAILY 90 tablet 0   No facility-administered medications prior to visit.    ROS Review of Systems  Constitutional: Negative.  Negative for diaphoresis and fatigue.  HENT: Negative.    Eyes: Negative.   Respiratory:  Negative for cough, chest tightness, shortness of breath and wheezing.   Cardiovascular:  Negative for chest pain, palpitations and leg swelling.  Gastrointestinal:  Negative for abdominal pain, constipation, diarrhea, nausea and vomiting.  Endocrine: Negative.   Genitourinary: Negative.  Negative for difficulty urinating.  Musculoskeletal:  Negative for arthralgias, joint swelling and myalgias.  Skin: Negative.   Neurological:  Negative for dizziness, weakness, light-headedness and headaches.  Hematological:  Negative for adenopathy. Does not bruise/bleed easily.  Psychiatric/Behavioral: Negative.      Objective:  BP 126/84  (BP Location: Right Arm, Patient Position: Sitting, Cuff Size: Normal)   Pulse 82   Temp 98 F (36.7 C) (Oral)   Resp 16   Ht 5\' 4"  (1.626 m)   Wt 130 lb (59 kg)   SpO2 98%   BMI 22.31 kg/m   BP Readings from Last 3 Encounters:  12/11/22 126/84  04/23/22 134/86  05/14/21 132/86    Wt Readings from Last 3 Encounters:  12/11/22 130 lb (59 kg)  04/23/22 127 lb (57.6 kg)  05/14/21 122 lb (55.3 kg)    Physical Exam Vitals reviewed.  Constitutional:      Appearance: Normal appearance.  HENT:     Nose: Nose normal.     Mouth/Throat:     Mouth: Mucous membranes are moist.  Eyes:     General: No scleral icterus.    Conjunctiva/sclera: Conjunctivae normal.  Cardiovascular:     Rate and Rhythm: Normal rate and regular rhythm.     Heart sounds: No murmur heard. Pulmonary:     Effort: Pulmonary effort is normal.     Breath sounds: No stridor. No wheezing, rhonchi or rales.  Abdominal:     General: Abdomen is flat.     Palpations: There is no mass.     Tenderness: There is no abdominal tenderness. There is no guarding.     Hernia: No hernia is present.  Musculoskeletal:        General: Normal range of motion.     Cervical back: Neck supple.     Right lower leg: No edema.     Left lower leg: No  edema.  Lymphadenopathy:     Cervical: No cervical adenopathy.  Skin:    General: Skin is warm and dry.  Neurological:     General: No focal deficit present.     Mental Status: She is alert. Mental status is at baseline.  Psychiatric:        Mood and Affect: Mood normal.        Behavior: Behavior normal.     Lab Results  Component Value Date   WBC 6.5 04/23/2022   HGB 13.6 04/23/2022   HCT 40.9 04/23/2022   PLT 368.0 04/23/2022   GLUCOSE 98 04/23/2022   CHOL 188 05/14/2021   TRIG 173.0 (H) 05/14/2021   HDL 75.80 05/14/2021   LDLCALC 77 05/14/2021   ALT 14 04/23/2022   AST 18 04/23/2022   NA 140 07/17/2022   K 4.0 07/17/2022   CL 105 07/17/2022   CREATININE 0.7  07/17/2022   BUN 9 07/17/2022   CO2 31 (A) 07/17/2022   TSH 1.87 04/23/2022    VAS [place] ABI WITH/WO TBI  Result Date: 05/20/2022  LOWER EXTREMITY DOPPLER STUDY Patient Name:  Michele Mills  Date of Exam:   05/20/2022 Medical Rec #: [id number]      Accession #:    [id number] Date of Birth: [birth year]       Patient Gender: F Patient Age:   5 years Exam Location:  [facility] Procedure:      VAS [place] ABI WITH/WO TBI Referring Phys: --------------------------------------------------------------------------------  Indications: Leg pain with prolonged sitting and standing that is relieved with              walking. High Risk Factors: Hypertension. Other Factors: Raynauds.  Performing Technologist: Dorthula Matas RVS, RCS  Examination Guidelines: A complete evaluation includes at minimum, Doppler waveform signals and systolic blood pressure reading at the level of bilateral brachial, anterior tibial, and posterior tibial arteries, when vessel segments are accessible. Bilateral testing is considered an integral part of a complete examination. Photoelectric Plethysmograph (PPG) waveforms and toe systolic pressure readings are included as required and additional duplex testing as needed. Limited examinations for reoccurring indications may be performed as noted.  ABI Findings: +---------+------------------+-----+--------+-----------------+ Right    Rt Pressure (mmHg)IndexWaveformComment           +---------+------------------+-----+--------+-----------------+ Brachial 140                                              +---------+------------------+-----+--------+-----------------+ PTA      163               1.15                           +---------+------------------+-----+--------+-----------------+ DP       165               1.16                           +---------+------------------+-----+--------+-----------------+ Great Toe124               0.87         dampened waveform  +---------+------------------+-----+--------+-----------------+ +---------+------------------+-----+--------+-----------------+ Left     Lt Pressure (mmHg)IndexWaveformComment           +---------+------------------+-----+--------+-----------------+ Brachial 142                                              +---------+------------------+-----+--------+-----------------+  PTA      170               1.20                           +---------+------------------+-----+--------+-----------------+ DP       157               1.11                           +---------+------------------+-----+--------+-----------------+ Great Toe112               0.79         dampened waveform +---------+------------------+-----+--------+-----------------+ +-------+-----------+-----------+------------+------------+ ABI/TBIToday's ABIToday's TBIPrevious ABIPrevious TBI +-------+-----------+-----------+------------+------------+ Right  1.16       0.87                                +-------+-----------+-----------+------------+------------+ Left   1.20       0.79                                +-------+-----------+-----------+------------+------------+  Summary: Right: Resting right ankle-brachial index is within normal range. Left: Resting left ankle-brachial index is within normal range. The left toe-brachial index is normal. *See table(s) above for measurements and observations.  Electronically signed by Sherald Hess MD on 05/20/2022 at 3:24:35 PM.    Final     Assessment & Plan:  Colon cancer screening -     Ambulatory referral to Gastroenterology  Essential hypertension- Her blood pressure is well-controlled. -     Nebivolol HCl; Take 1 tablet (5 mg total) by mouth daily.  Dispense: 90 tablet; Refill: 1  Encounter for general adult medical examination with abnormal findings- Exam completed, labs reviewed-statin is not indicated, vaccines updated, cancer screenings addressed,  patient education was given.  Other orders -     Tdap vaccine greater than or equal to 7yo IM   Assessment and Plan    Raynaud's Phenomenon: Tolerating amlodipine with few episodes of ankle edema. No other symptoms reported. -Continue amlodipine and monitor for edema.  Cardiovascular Health: No symptoms of chest pain, shortness of breath, palpitations, dizziness, lightheadedness, or edema reported. Currently on nebivolol. -Continue nebivolol.  Musculoskeletal Health: No joint pain or swelling reported. Not taking any medication for musculoskeletal pain. -Monitor for any changes in musculoskeletal health.        Follow-up: Return in about 6 months (around 06/12/2023).  Sanda Linger, MD

## 2022-12-11 NOTE — Patient Instructions (Signed)

## 2022-12-13 DIAGNOSIS — Z0001 Encounter for general adult medical examination with abnormal findings: Secondary | ICD-10-CM | POA: Insufficient documentation

## 2022-12-13 DIAGNOSIS — Z1211 Encounter for screening for malignant neoplasm of colon: Secondary | ICD-10-CM | POA: Insufficient documentation

## 2022-12-13 DIAGNOSIS — I10 Essential (primary) hypertension: Secondary | ICD-10-CM | POA: Insufficient documentation

## 2023-02-18 ENCOUNTER — Encounter: Payer: Self-pay | Admitting: Gastroenterology

## 2023-04-20 ENCOUNTER — Encounter: Payer: Federal, State, Local not specified - PPO | Admitting: Gastroenterology

## 2023-05-10 LAB — COLOGUARD
COLOGUARD: NEGATIVE
Cologuard: NEGATIVE

## 2023-05-10 LAB — EXTERNAL GENERIC LAB PROCEDURE: COLOGUARD: NEGATIVE

## 2023-09-01 ENCOUNTER — Ambulatory Visit: Payer: Federal, State, Local not specified - PPO | Admitting: Internal Medicine

## 2023-09-16 ENCOUNTER — Ambulatory Visit: Payer: Federal, State, Local not specified - PPO | Admitting: Internal Medicine

## 2023-09-17 ENCOUNTER — Encounter: Payer: Self-pay | Admitting: Internal Medicine

## 2023-09-17 ENCOUNTER — Ambulatory Visit: Attending: Internal Medicine

## 2023-09-17 ENCOUNTER — Ambulatory Visit: Payer: Federal, State, Local not specified - PPO | Admitting: Internal Medicine

## 2023-09-17 VITALS — BP 144/96 | HR 83 | Temp 97.7°F | Resp 16 | Ht 64.0 in | Wt 125.2 lb

## 2023-09-17 DIAGNOSIS — N938 Other specified abnormal uterine and vaginal bleeding: Secondary | ICD-10-CM | POA: Diagnosis not present

## 2023-09-17 DIAGNOSIS — R001 Bradycardia, unspecified: Secondary | ICD-10-CM | POA: Diagnosis not present

## 2023-09-17 DIAGNOSIS — I10 Essential (primary) hypertension: Secondary | ICD-10-CM

## 2023-09-17 LAB — URINALYSIS, ROUTINE W REFLEX MICROSCOPIC
Bilirubin Urine: NEGATIVE
Ketones, ur: NEGATIVE
Leukocytes,Ua: NEGATIVE
Nitrite: NEGATIVE
Specific Gravity, Urine: <=1.005 — AB (ref 1.000–1.030)
Total Protein, Urine: NEGATIVE
Urine Glucose: NEGATIVE
Urobilinogen, UA: 0.2 (ref 0.0–1.0)
pH: 6 (ref 5.0–8.0)

## 2023-09-17 LAB — BASIC METABOLIC PANEL
BUN: 11 mg/dL (ref 6–23)
CO2: 28 meq/L (ref 19–32)
Calcium: 9.4 mg/dL (ref 8.4–10.5)
Chloride: 103 meq/L (ref 96–112)
Creatinine, Ser: 0.74 mg/dL (ref 0.40–1.20)
GFR: 96.89 mL/min (ref >60.00)
Glucose, Bld: 95 mg/dL (ref 70–99)
Potassium: 3.7 meq/L (ref 3.5–5.1)
Sodium: 137 meq/L (ref 135–145)

## 2023-09-17 LAB — HEPATIC FUNCTION PANEL
ALT: 15 U/L (ref 0–35)
AST: 17 U/L (ref 0–37)
Albumin: 4.8 g/dL (ref 3.5–5.2)
Alkaline Phosphatase: 43 U/L (ref 39–117)
Bilirubin, Direct: 0.1 mg/dL (ref 0.0–0.3)
Total Bilirubin: 0.7 mg/dL (ref 0.2–1.2)
Total Protein: 7.5 g/dL (ref 6.0–8.3)

## 2023-09-17 LAB — CBC WITH DIFFERENTIAL/PLATELET
Basophils Absolute: 0 10*3/uL (ref 0.0–0.1)
Basophils Relative: 0.4 % (ref 0.0–3.0)
Eosinophils Absolute: 0.1 10*3/uL (ref 0.0–0.7)
Eosinophils Relative: 1.5 % (ref 0.0–5.0)
HCT: 43 % (ref 36.0–46.0)
Hemoglobin: 14.3 g/dL (ref 12.0–15.0)
Lymphocytes Relative: 16.8 % (ref 12.0–46.0)
Lymphs Abs: 1.2 10*3/uL (ref 0.7–4.0)
MCHC: 33.2 g/dL (ref 30.0–36.0)
MCV: 92.3 fl (ref 78.0–100.0)
Monocytes Absolute: 0.4 10*3/uL (ref 0.1–1.0)
Monocytes Relative: 5.6 % (ref 3.0–12.0)
Neutro Abs: 5.6 10*3/uL (ref 1.4–7.7)
Neutrophils Relative %: 75.7 % (ref 43.0–77.0)
Platelets: 304 10*3/uL (ref 150.0–400.0)
RBC: 4.66 Mil/uL (ref 3.87–5.11)
RDW: 13.7 % (ref 11.5–15.5)
WBC: 7.4 10*3/uL (ref 4.0–10.5)

## 2023-09-17 LAB — HCG, QUANTITATIVE, PREGNANCY: Quantitative HCG: <0.6 m[IU]/mL

## 2023-09-17 MED ORDER — SPIRONOLACTONE 25 MG PO TABS: 25.0000 mg | ORAL_TABLET | Freq: Every day | ORAL | 0 refills | Status: DC

## 2023-09-17 NOTE — Progress Notes (Unsigned)
 EP to read.

## 2023-09-17 NOTE — Patient Instructions (Signed)
 Hypertension, Adult High blood pressure (hypertension) is when the force of blood pumping through the arteries is too strong. The arteries are the blood vessels that carry blood from the heart throughout the body. Hypertension forces the heart to work harder to pump blood and may cause arteries to become narrow or stiff. Untreated or uncontrolled hypertension can lead to a heart attack, heart failure, a stroke, kidney disease, and other problems. A blood pressure reading consists of a higher number over a lower number. Ideally, your blood pressure should be below 120/80. The first ("top") number is called the systolic pressure. It is a measure of the pressure in your arteries as your heart beats. The second ("bottom") number is called the diastolic pressure. It is a measure of the pressure in your arteries as the heart relaxes. What are the causes? The exact cause of this condition is not known. There are some conditions that result in high blood pressure. What increases the risk? Certain factors may make you more likely to develop high blood pressure. Some of these risk factors are under your control, including: Smoking. Not getting enough exercise or physical activity. Being overweight. Having too much fat, sugar, calories, or salt (sodium) in your diet. Drinking too much alcohol. Other risk factors include: Having a personal history of heart disease, diabetes, high cholesterol, or kidney disease. Stress. Having a family history of high blood pressure and high cholesterol. Having obstructive sleep apnea. Age. The risk increases with age. What are the signs or symptoms? High blood pressure may not cause symptoms. Very high blood pressure (hypertensive crisis) may cause: Headache. Fast or irregular heartbeats (palpitations). Shortness of breath. Nosebleed. Nausea and vomiting. Vision changes. Severe chest pain, dizziness, and seizures. How is this diagnosed? This condition is diagnosed by  measuring your blood pressure while you are seated, with your arm resting on a flat surface, your legs uncrossed, and your feet flat on the floor. The cuff of the blood pressure monitor will be placed directly against the skin of your upper arm at the level of your heart. Blood pressure should be measured at least twice using the same arm. Certain conditions can cause a difference in blood pressure between your right and left arms. If you have a high blood pressure reading during one visit or you have normal blood pressure with other risk factors, you may be asked to: Return on a different day to have your blood pressure checked again. Monitor your blood pressure at home for 1 week or longer. If you are diagnosed with hypertension, you may have other blood or imaging tests to help your health care provider understand your overall risk for other conditions. How is this treated? This condition is treated by making healthy lifestyle changes, such as eating healthy foods, exercising more, and reducing your alcohol intake. You may be referred for counseling on a healthy diet and physical activity. Your health care provider may prescribe medicine if lifestyle changes are not enough to get your blood pressure under control and if: Your systolic blood pressure is above 130. Your diastolic blood pressure is above 80. Your personal target blood pressure may vary depending on your medical conditions, your age, and other factors. Follow these instructions at home: Eating and drinking  Eat a diet that is high in fiber and potassium, and low in sodium, added sugar, and fat. An example of this eating plan is called the DASH diet. DASH stands for Dietary Approaches to Stop Hypertension. To eat this way: Eat  plenty of fresh fruits and vegetables. Try to fill one half of your plate at each meal with fruits and vegetables. Eat whole grains, such as whole-wheat pasta, brown rice, or whole-grain bread. Fill about one  fourth of your plate with whole grains. Eat or drink low-fat dairy products, such as skim milk or low-fat yogurt. Avoid fatty cuts of meat, processed or cured meats, and poultry with skin. Fill about one fourth of your plate with lean proteins, such as fish, chicken without skin, beans, eggs, or tofu. Avoid pre-made and processed foods. These tend to be higher in sodium, added sugar, and fat. Reduce your daily sodium intake. Many people with hypertension should eat less than 1,500 mg of sodium a day. Do not drink alcohol if: Your health care provider tells you not to drink. You are pregnant, may be pregnant, or are planning to become pregnant. If you drink alcohol: Limit how much you have to: 0-1 drink a day for women. 0-2 drinks a day for men. Know how much alcohol is in your drink. In the [place] one drink equals one 12 oz bottle of beer (355 mL), one 5 oz glass of wine (148 mL), or one 1 oz glass of hard liquor (44 mL). Lifestyle  Work with your health care provider to maintain a healthy body weight or to lose weight. Ask what an ideal weight is for you. Get at least 30 minutes of exercise that causes your heart to beat faster (aerobic exercise) most days of the week. Activities may include walking, swimming, or biking. Include exercise to strengthen your muscles (resistance exercise), such as Pilates or lifting weights, as part of your weekly exercise routine. Try to do these types of exercises for 30 minutes at least 3 days a week. Do not use any products that contain nicotine or tobacco. These products include cigarettes, chewing tobacco, and vaping devices, such as e-cigarettes. If you need help quitting, ask your health care provider. Monitor your blood pressure at home as told by your health care provider. Keep all follow-up visits. This is important. Medicines Take over-the-counter and prescription medicines only as told by your health care provider. Follow directions carefully. Blood  pressure medicines must be taken as prescribed. Do not skip doses of blood pressure medicine. Doing this puts you at risk for problems and can make the medicine less effective. Ask your health care provider about side effects or reactions to medicines that you should watch for. Contact a health care provider if you: Think you are having a reaction to a medicine you are taking. Have headaches that keep coming back (recurring). Feel dizzy. Have swelling in your ankles. Have trouble with your vision. Get help right away if you: Develop a severe headache or confusion. Have unusual weakness or numbness. Feel faint. Have severe pain in your chest or abdomen. Vomit repeatedly. Have trouble breathing. These symptoms may be an emergency. Get help right away. Call 911. Do not wait to see if the symptoms will go away. Do not drive yourself to the hospital. Summary Hypertension is when the force of blood pumping through your arteries is too strong. If this condition is not controlled, it may put you at risk for serious complications. Your personal target blood pressure may vary depending on your medical conditions, your age, and other factors. For most people, a normal blood pressure is less than 120/80. Hypertension is treated with lifestyle changes, medicines, or a combination of both. Lifestyle changes include losing weight, eating a healthy,  low-sodium diet, exercising more, and limiting alcohol. This information is not intended to replace advice given to you by your health care provider. Make sure you discuss any questions you have with your health care provider. Document Revised: 04/23/2021 Document Reviewed: 04/23/2021 Elsevier Patient Education  2024 [employer].

## 2023-09-17 NOTE — Progress Notes (Signed)
 Subjective:  Patient ID: Michele Mills, female    DOB: Jun 29, 1976  Age: 48 y.o. MRN: [id number]  CC: Hypertension   HPI Michele Mills presents for f/up ----  Discussed the use of AI scribe software for clinical note transcription with the patient, who gave verbal consent to proceed.  History of Present Illness   Michele Mills is a 48 year old female with hypertension who presents with blood pressure management issues and side effects from medication.  She has been experiencing difficulties in managing her blood pressure. Previously, she was on Bystolic and amlodipine but discontinued amlodipine last fall due to severe side effects, including hand tremors. She managed well on Bystolic alone until after Christmas when illness and work-related stress led to elevated blood pressure readings at home, such as 150/100 mmHg and 170/110 mmHg during a visit to a Cardiologist in [place] three weeks ago.  She was prescribed losartan, which initially reduced her blood pressure but caused significant side effects, including prolonged menstrual bleeding and exacerbation of endometriosis symptoms, such as bloating and water retention. She discontinued losartan last Sunday due to these side effects and is currently only taking Bystolic. No headaches, blurred vision, palpitations, dizziness, or lightheadedness. However, her heart rate drops to the fifties when sitting for extended periods, causing fatigue and weakness, prompting her to check her heart rate using her watch. Her heart rate typically increases above 100 during exercise.  She has been experiencing continuous uterine bleeding for the past three weeks, which began as a regular period but has persisted. The bleeding was heavier for the first nine days and then became lighter but has not stopped. She attributes some irregularity in her cycles to perimenopause. Her last gynecological visit was in August or September, where no Pap smear was  performed.  Her husband is currently in [place], and she mentions experiencing stress at work. No trouble sleeping, anxiety affecting sleep, depression, or thoughts of self-harm.       Outpatient Medications Prior to Visit  Medication Sig Dispense Refill   Cetirizine HCl (ZYRTEC ALLERGY PO) Take by mouth as needed.     fluconazole (DIFLUCAN) 150 MG tablet Take 150 mg by mouth as needed.     nebivolol (BYSTOLIC) 5 MG tablet Take 1 tablet (5 mg total) by mouth daily. 90 tablet 1   levocetirizine (XYZAL) 5 MG tablet Take 1 tablet (5 mg total) by mouth every evening. 90 tablet 1   potassium chloride SA (KLOR-CON M) 20 MEQ tablet Take 1 tablet (20 mEq total) by mouth daily. 90 tablet 0   No facility-administered medications prior to visit.    ROS Review of Systems  Constitutional: Negative.  Negative for appetite change, chills, diaphoresis and fatigue.  HENT: Negative.    Eyes: Negative.   Respiratory: Negative.  Negative for cough, chest tightness, shortness of breath and wheezing.   Cardiovascular:  Positive for palpitations. Negative for chest pain and leg swelling.  Gastrointestinal:  Negative for abdominal pain, constipation, diarrhea, nausea and vomiting.  Endocrine: Negative.   Genitourinary:  Positive for vaginal bleeding. Negative for vaginal discharge and vaginal pain.  Musculoskeletal: Negative.   Skin: Negative.   Neurological:  Negative for dizziness, weakness and headaches.  Hematological:  Negative for adenopathy. Does not bruise/bleed easily.  Psychiatric/Behavioral:  Positive for dysphoric mood. Negative for sleep disturbance and suicidal ideas. The patient is nervous/anxious.     Objective:  BP (!) 144/96 (BP Location: Left Arm, Patient Position: Sitting, Cuff Size: Small)  Pulse 83   Temp 97.7 F (36.5 C) (Temporal)   Resp 16   Ht 5\' 4"  (1.626 m)   Wt 125 lb 3.2 oz (56.8 kg)   LMP 09/03/2023   SpO2 97%   BMI 21.49 kg/m   BP Readings from Last 3  Encounters:  09/17/23 (!) 144/96  12/11/22 126/84  04/23/22 134/86    Wt Readings from Last 3 Encounters:  09/17/23 125 lb 3.2 oz (56.8 kg)  12/11/22 130 lb (59 kg)  04/23/22 127 lb (57.6 kg)    Physical Exam Vitals reviewed.  HENT:     Nose: Nose normal.     Mouth/Throat:     Mouth: Mucous membranes are moist.  Eyes:     General: No scleral icterus.    Conjunctiva/sclera: Conjunctivae normal.  Cardiovascular:     Rate and Rhythm: Normal rate and regular rhythm.     Heart sounds: No murmur heard.    No gallop.  Pulmonary:     Effort: Pulmonary effort is normal.     Breath sounds: No stridor. No wheezing, rhonchi or rales.  Abdominal:     General: Abdomen is flat.     Palpations: There is no mass.     Tenderness: There is no abdominal tenderness. There is no guarding.     Hernia: No hernia is present.  Musculoskeletal:        General: Normal range of motion.     Cervical back: Neck supple.     Right lower leg: No edema.     Left lower leg: No edema.  Lymphadenopathy:     Cervical: No cervical adenopathy.  Skin:    General: Skin is warm.  Neurological:     General: No focal deficit present.     Mental Status: She is alert. Mental status is at baseline.  Psychiatric:        Mood and Affect: Mood normal.        Behavior: Behavior normal.     Lab Results  Component Value Date   WBC 7.4 09/17/2023   HGB 14.3 09/17/2023   HCT 43.0 09/17/2023   PLT 304.0 09/17/2023   GLUCOSE 95 09/17/2023   CHOL 188 05/14/2021   TRIG 173.0 (H) 05/14/2021   HDL 75.80 05/14/2021   LDLCALC 77 05/14/2021   ALT 15 09/17/2023   AST 17 09/17/2023   NA 137 09/17/2023   K 3.7 09/17/2023   CL 103 09/17/2023   CREATININE 0.74 09/17/2023   BUN 11 09/17/2023   CO2 28 09/17/2023   TSH 1.87 04/23/2022    VAS [place] ABI WITH/WO TBI Result Date: 05/20/2022  LOWER EXTREMITY DOPPLER STUDY Patient Name:  Michele Mills  Date of Exam:   05/20/2022 Medical Rec #: [id number]      Accession #:     [id number] Date of Birth: [birth year]       Patient Gender: F Patient Age:   74 years Exam Location:  [facility] Procedure:      VAS [place] ABI WITH/WO TBI Referring Phys: --------------------------------------------------------------------------------  Indications: Leg pain with prolonged sitting and standing that is relieved with              walking. High Risk Factors: Hypertension. Other Factors: Raynauds.  Performing Technologist: Dorthula Matas RVS, RCS  Examination Guidelines: A complete evaluation includes at minimum, Doppler waveform signals and systolic blood pressure reading at the level of bilateral brachial, anterior tibial, and posterior tibial arteries, when vessel segments are accessible. Bilateral  testing is considered an integral part of a complete examination. Photoelectric Plethysmograph (PPG) waveforms and toe systolic pressure readings are included as required and additional duplex testing as needed. Limited examinations for reoccurring indications may be performed as noted.  ABI Findings: +---------+------------------+-----+--------+-----------------+ Right    Rt Pressure (mmHg)IndexWaveformComment           +---------+------------------+-----+--------+-----------------+ Brachial 140                                              +---------+------------------+-----+--------+-----------------+ PTA      163               1.15                           +---------+------------------+-----+--------+-----------------+ DP       165               1.16                           +---------+------------------+-----+--------+-----------------+ Great Toe124               0.87         dampened waveform +---------+------------------+-----+--------+-----------------+ +---------+------------------+-----+--------+-----------------+ Left     Lt Pressure (mmHg)IndexWaveformComment           +---------+------------------+-----+--------+-----------------+ Brachial  142                                              +---------+------------------+-----+--------+-----------------+ PTA      170               1.20                           +---------+------------------+-----+--------+-----------------+ DP       157               1.11                           +---------+------------------+-----+--------+-----------------+ Great Toe112               0.79         dampened waveform +---------+------------------+-----+--------+-----------------+ +-------+-----------+-----------+------------+------------+ ABI/TBIToday's ABIToday's TBIPrevious ABIPrevious TBI +-------+-----------+-----------+------------+------------+ Right  1.16       0.87                                +-------+-----------+-----------+------------+------------+ Left   1.20       0.79                                +-------+-----------+-----------+------------+------------+  Summary: Right: Resting right ankle-brachial index is within normal range. Left: Resting left ankle-brachial index is within normal range. The left toe-brachial index is normal. *See table(s) above for measurements and observations.  Electronically signed by Sherald Hess MD on 05/20/2022 at 3:24:35 PM.    Final     Assessment & Plan:   DUB (dysfunctional uterine bleeding) -     Ambulatory referral to Gynecology -  hCG, quantitative, pregnancy; Future  Symptomatic bradycardia -     LONG TERM MONITOR (3-14 DAYS); Future -     Thyroid Panel With TSH; Future -     Hepatic function panel; Future -     CBC with Differential/Platelet; Future -     Basic metabolic panel; Future  Primary hypertension- Will evaluate for secondary causes. K+ is 3.7. Will start dyazide. -     Thyroid Panel With TSH; Future -     Hepatic function panel; Future -     Urinalysis, Routine w reflex microscopic; Future -     CBC with Differential/Platelet; Future -     Basic metabolic panel; Future -     Aldosterone  + renin activity w/ ratio; Future -     Triamterene-HCTZ; Take 1 each (1 capsule total) by mouth daily.  Dispense: 90 capsule; Refill: 0     Follow-up: Return in about 3 months (around 12/18/2023).  Sanda Linger, MD

## 2023-09-18 ENCOUNTER — Encounter: Payer: Self-pay | Admitting: Internal Medicine

## 2023-09-18 MED ORDER — TRIAMTERENE-HCTZ 37.5-25 MG PO CAPS: 1.0000 | ORAL_CAPSULE | Freq: Every day | ORAL | 0 refills | Status: DC

## 2023-09-24 LAB — ALDOSTERONE + RENIN ACTIVITY W/ RATIO
ALDO / PRA Ratio: 4 ratio (ref 0.9–28.9)
Aldosterone: 8 ng/dL
Renin Activity: 2.02 ng/mL/h (ref 0.25–5.82)

## 2023-09-24 LAB — THYROID PANEL WITH TSH
Free Thyroxine Index: 2.8 (ref 1.4–3.8)
T3 Uptake: 29 % (ref 22–35)
T4, Total: 9.6 ug/dL (ref 5.1–11.9)
TSH: 1.15 m[IU]/L

## 2023-09-25 ENCOUNTER — Encounter: Payer: Self-pay | Admitting: Internal Medicine

## 2023-10-14 ENCOUNTER — Other Ambulatory Visit: Payer: Self-pay | Admitting: Internal Medicine

## 2023-10-14 DIAGNOSIS — I10 Essential (primary) hypertension: Secondary | ICD-10-CM

## 2023-10-19 ENCOUNTER — Encounter: Payer: Self-pay | Admitting: Internal Medicine

## 2023-10-21 ENCOUNTER — Other Ambulatory Visit: Payer: Self-pay | Admitting: Internal Medicine

## 2023-10-21 DIAGNOSIS — I10 Essential (primary) hypertension: Secondary | ICD-10-CM

## 2023-10-21 MED ORDER — NEBIVOLOL HCL 5 MG PO TABS: 5.0000 mg | ORAL_TABLET | Freq: Every day | ORAL | 0 refills | Status: DC

## 2023-10-22 ENCOUNTER — Encounter: Payer: Self-pay | Admitting: Internal Medicine

## 2023-10-22 DIAGNOSIS — R001 Bradycardia, unspecified: Secondary | ICD-10-CM

## 2023-11-10 ENCOUNTER — Ambulatory Visit: Admitting: Internal Medicine

## 2023-12-16 ENCOUNTER — Ambulatory Visit: Admitting: Internal Medicine

## 2024-01-19 ENCOUNTER — Other Ambulatory Visit: Payer: Self-pay | Admitting: Internal Medicine

## 2024-01-19 DIAGNOSIS — I10 Essential (primary) hypertension: Secondary | ICD-10-CM

## 2024-03-23 ENCOUNTER — Encounter: Admitting: Internal Medicine

## 2024-04-18 ENCOUNTER — Other Ambulatory Visit: Payer: Self-pay | Admitting: Internal Medicine

## 2024-04-18 DIAGNOSIS — I10 Essential (primary) hypertension: Secondary | ICD-10-CM

## 2024-04-20 ENCOUNTER — Encounter: Admitting: Internal Medicine

## 2024-04-27 LAB — HM MAMMOGRAPHY

## 2024-05-17 ENCOUNTER — Encounter: Payer: Self-pay | Admitting: Internal Medicine

## 2024-05-17 ENCOUNTER — Ambulatory Visit (INDEPENDENT_AMBULATORY_CARE_PROVIDER_SITE_OTHER): Admitting: Internal Medicine

## 2024-05-17 ENCOUNTER — Ambulatory Visit: Payer: Self-pay | Admitting: Internal Medicine

## 2024-05-17 VITALS — BP 136/84 | HR 70 | Temp 97.9°F | Resp 16 | Ht 64.0 in | Wt 126.0 lb

## 2024-05-17 DIAGNOSIS — Z0000 Encounter for general adult medical examination without abnormal findings: Secondary | ICD-10-CM | POA: Diagnosis not present

## 2024-05-17 DIAGNOSIS — I10 Essential (primary) hypertension: Secondary | ICD-10-CM | POA: Diagnosis not present

## 2024-05-17 DIAGNOSIS — K2100 Gastro-esophageal reflux disease with esophagitis, without bleeding: Secondary | ICD-10-CM | POA: Diagnosis not present

## 2024-05-17 DIAGNOSIS — Z0001 Encounter for general adult medical examination with abnormal findings: Secondary | ICD-10-CM

## 2024-05-17 LAB — BASIC METABOLIC PANEL WITH GFR
BUN: 11 mg/dL (ref 6–23)
CO2: 29 meq/L (ref 19–32)
Calcium: 9.2 mg/dL (ref 8.4–10.5)
Chloride: 104 meq/L (ref 96–112)
Creatinine, Ser: 0.75 mg/dL (ref 0.40–1.20)
GFR: 94.9 mL/min (ref >60.00)
Glucose, Bld: 90 mg/dL (ref 70–99)
Potassium: 4.3 meq/L (ref 3.5–5.1)
Sodium: 139 meq/L (ref 135–145)

## 2024-05-17 LAB — CBC WITH DIFFERENTIAL/PLATELET
Basophils Absolute: 0 K/uL (ref 0.0–0.1)
Basophils Relative: 0.4 % (ref 0.0–3.0)
Eosinophils Absolute: 0.1 K/uL (ref 0.0–0.7)
Eosinophils Relative: 1.6 % (ref 0.0–5.0)
HCT: 40.1 % (ref 36.0–46.0)
Hemoglobin: 13.4 g/dL (ref 12.0–15.0)
Lymphocytes Relative: 34.3 % (ref 12.0–46.0)
Lymphs Abs: 1.7 K/uL (ref 0.7–4.0)
MCHC: 33.4 g/dL (ref 30.0–36.0)
MCV: 91.2 fl (ref 78.0–100.0)
Monocytes Absolute: 0.3 K/uL (ref 0.1–1.0)
Monocytes Relative: 7.1 % (ref 3.0–12.0)
Neutro Abs: 2.7 K/uL (ref 1.4–7.7)
Neutrophils Relative %: 56.6 % (ref 43.0–77.0)
Platelets: 279 K/uL (ref 150.0–400.0)
RBC: 4.39 Mil/uL (ref 3.87–5.11)
RDW: 13.4 % (ref 11.5–15.5)
WBC: 4.8 K/uL (ref 4.0–10.5)

## 2024-05-17 LAB — TSH: TSH: 1.89 u[IU]/mL (ref 0.35–5.50)

## 2024-05-17 LAB — LIPID PANEL
Cholesterol: 188 mg/dL (ref 0–200)
HDL: 73.5 mg/dL (ref >39.00)
LDL Cholesterol: 99 mg/dL (ref 0–99)
NonHDL: 114.81
Total CHOL/HDL Ratio: 3
Triglycerides: 81 mg/dL (ref 0.0–149.0)
VLDL: 16.2 mg/dL (ref 0.0–40.0)

## 2024-05-17 NOTE — Progress Notes (Signed)
 "  Subjective:  Patient ID: Michele Mills, female    DOB: [birth year]  Age: 48 y.o. MRN: [id number]  CC: Annual Exam and Hypertension   HPI Michele Mills presents for a CPX and f/up --  Discussed the use of AI scribe software for clinical note transcription with the patient, who gave verbal consent to proceed.  History of Present Illness Michele Mills is a 48 year old female with hypertension who presents for a follow-up visit.  She has no symptoms of hypertension such as headache, blurred vision, chest pain, shortness of breath, dizziness, or lightheadedness. She reports that her blood pressure has been good on nebivolol , and she has not experienced any side effects from this medication. She previously tried a second medication for blood pressure but could not tolerate it due to side effects, including muscle pains and menstrual cycle disturbances.  She has been spending most of her time in [place]  and has not seen a cardiologist since her last visit there. Occasionally, her heart rate drops to the fifties, but she feels okay and does not experience dizziness or presyncope.  She does not want to receive a flu shot, COVID vaccine, or hepatitis B vaccines. Her last Pap smear was two years ago.  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     Outpatient Medications Prior to Visit  Medication Sig Dispense Refill   Cetirizine HCl (ZYRTEC ALLERGY PO) Take by mouth as needed.     fluconazole  (DIFLUCAN ) 150 MG tablet Take 150 mg by mouth as needed.     nystatin cream (MYCOSTATIN) Apply 1 Application topically 2 (two) times daily.     triamcinolone  cream (KENALOG) 0.1 % Apply 1 Application topically 2 (two) times daily.     nebivolol  (BYSTOLIC ) 5 MG tablet TAKE 1 TABLET(5 MG) BY MOUTH DAILY 90 tablet 0   triamterene -hydrochlorothiazide  (DYAZIDE) 37.5-25 MG capsule Take 1 each (1 capsule total) by mouth daily. 90 capsule 0   No facility-administered  medications prior to visit.    ROS Review of Systems  Constitutional:  Negative for appetite change, chills, diaphoresis, fatigue and fever.  HENT: Negative.    Eyes: Negative.   Respiratory: Negative.  Negative for cough, chest tightness, shortness of breath and wheezing.   Cardiovascular:  Negative for chest pain, palpitations and leg swelling.  Gastrointestinal: Negative.  Negative for abdominal pain, constipation, diarrhea, nausea and vomiting.  Genitourinary: Negative.  Negative for difficulty urinating.  Musculoskeletal: Negative.  Negative for arthralgias and myalgias.  Skin: Negative.   Neurological: Negative.  Negative for dizziness, weakness and light-headedness.  Hematological:  Negative for adenopathy. Does not bruise/bleed easily.  Psychiatric/Behavioral: Negative.      Objective:  BP 136/84 (BP Location: Left Arm, Patient Position: Sitting)   Pulse 70   Temp 97.9 F (36.6 C) (Temporal)   Resp 16   Ht 5' 4 (1.626 m)   Wt 126 lb (57.2 kg)   LMP 04/26/2024 (Approximate)   SpO2 98%   BMI 21.63 kg/m   BP Readings from Last 3 Encounters:  05/17/24 136/84  09/17/23 (!) 144/96  12/11/22 126/84    Wt Readings from Last 3 Encounters:  05/17/24 126 lb (57.2 kg)  09/17/23 125 lb 3.2 oz (56.8 kg)  12/11/22 130 lb (59 kg)    Physical Exam Vitals reviewed.  Constitutional:      Appearance: Normal appearance.  HENT:     Nose: Nose normal.     Mouth/Throat:  Mouth: Mucous membranes are moist.  Eyes:     General: No scleral icterus.    Conjunctiva/sclera: Conjunctivae normal.  Cardiovascular:     Rate and Rhythm: Normal rate and regular rhythm.     Heart sounds: No murmur heard.    No friction rub. No gallop.     Comments: EKG--- NSR, 69 bpm No LVH, Q waves, or ST/T wave changes  Pulmonary:     Effort: Pulmonary effort is normal.     Breath sounds: No stridor. No wheezing, rhonchi or rales.  Abdominal:     General: Abdomen is flat.     Palpations:  There is no mass.     Tenderness: There is no abdominal tenderness. There is no guarding.     Hernia: No hernia is present.  Musculoskeletal:        General: Normal range of motion.     Cervical back: Neck supple.     Right lower leg: No edema.     Left lower leg: No edema.  Lymphadenopathy:     Cervical: No cervical adenopathy.  Skin:    General: Skin is warm and dry.  Neurological:     General: No focal deficit present.     Mental Status: Mental status is at baseline.  Psychiatric:        Mood and Affect: Mood normal.        Behavior: Behavior normal.     Lab Results  Component Value Date   WBC 4.8 05/17/2024   HGB 13.4 05/17/2024   HCT 40.1 05/17/2024   PLT 279.0 05/17/2024   GLUCOSE 90 05/17/2024   CHOL 188 05/17/2024   TRIG 81.0 05/17/2024   HDL 73.50 05/17/2024   LDLCALC 99 05/17/2024   ALT 15 09/17/2023   AST 17 09/17/2023   NA 139 05/17/2024   K 4.3 05/17/2024   CL 104 05/17/2024   CREATININE 0.75 05/17/2024   BUN 11 05/17/2024   CO2 29 05/17/2024   TSH 1.89 05/17/2024    VAS US  ABI WITH/WO TBI Result Date: 05/20/2022  LOWER EXTREMITY DOPPLER STUDY Patient Name:  Michele Mills  Date of Exam:   05/20/2022 Medical Rec #: [id number]      Accession #:    [id number] Date of Birth: [birth year]       Patient Gender: F Patient Age:   11 years Exam Location:  [facility] Procedure:      VAS US  ABI WITH/WO TBI Referring Phys: --------------------------------------------------------------------------------  Indications: Leg pain with prolonged sitting and standing that is relieved with              walking. High Risk Factors: Hypertension. Other Factors: Raynauds.  Performing Technologist: Geni Lodge RVS, RCS  Examination Guidelines: A complete evaluation includes at minimum, Doppler waveform signals and systolic blood pressure reading at the level of bilateral brachial, anterior tibial, and posterior tibial arteries, when vessel segments are accessible.  Bilateral testing is considered an integral part of a complete examination. Photoelectric Plethysmograph (PPG) waveforms and toe systolic pressure readings are included as required and additional duplex testing as needed. Limited examinations for reoccurring indications may be performed as noted.  ABI Findings: +---------+------------------+-----+--------+-----------------+ Right    Rt Pressure (mmHg)IndexWaveformComment           +---------+------------------+-----+--------+-----------------+ Brachial 140                                              +---------+------------------+-----+--------+-----------------+  PTA      163               1.15                           +---------+------------------+-----+--------+-----------------+ DP       165               1.16                           +---------+------------------+-----+--------+-----------------+ Great Toe124               0.87         dampened waveform +---------+------------------+-----+--------+-----------------+ +---------+------------------+-----+--------+-----------------+ Left     Lt Pressure (mmHg)IndexWaveformComment           +---------+------------------+-----+--------+-----------------+ Brachial 142                                              +---------+------------------+-----+--------+-----------------+ PTA      170               1.20                           +---------+------------------+-----+--------+-----------------+ DP       157               1.11                           +---------+------------------+-----+--------+-----------------+ Great Toe112               0.79         dampened waveform +---------+------------------+-----+--------+-----------------+ +-------+-----------+-----------+------------+------------+ ABI/TBIToday's ABIToday's TBIPrevious ABIPrevious TBI +-------+-----------+-----------+------------+------------+ Right  1.16       0.87                                 +-------+-----------+-----------+------------+------------+ Left   1.20       0.79                                +-------+-----------+-----------+------------+------------+  Summary: Right: Resting right ankle-brachial index is within normal range. Left: Resting left ankle-brachial index is within normal range. The left toe-brachial index is normal. *See table(s) above for measurements and observations.  Electronically signed by Lonni Gaskins MD on 05/20/2022 at 3:24:35 PM.    Final     Assessment & Plan:   Primary hypertension- BP is adequately well controlled. -     Basic metabolic panel with GFR; Future -     CBC with Differential/Platelet; Future -     TSH; Future -     EKG 12-Lead  Gastroesophageal reflux disease with esophagitis without hemorrhage- No symptoms. -     CBC with Differential/Platelet; Future  Encounter for general adult medical examination with abnormal findings- Exam completed, labs reviewed (statin is not indicated), vaccines reviewed, cancer screenings are UTD, pt ed material was given.  -     Lipid panel; Future    Follow-up: Return in about 6 months (around 11/14/2024).  Debby Molt, MD "

## 2024-05-17 NOTE — Patient Instructions (Signed)

## 2024-05-18 MED ORDER — NEBIVOLOL HCL 5 MG PO TABS: 5.0000 mg | ORAL_TABLET | Freq: Every day | ORAL | 1 refills | Status: AC

## 2024-05-21 ENCOUNTER — Ambulatory Visit (HOSPITAL_COMMUNITY): Admission: EM | Admit: 2024-05-21 | Discharge: 2024-05-21 | Disposition: A | Discharge status: Home / Self Care

## 2024-05-21 ENCOUNTER — Encounter (HOSPITAL_COMMUNITY): Payer: Self-pay

## 2024-05-21 DIAGNOSIS — W57XXXA Bitten or stung by nonvenomous insect and other nonvenomous arthropods, initial encounter: Secondary | ICD-10-CM

## 2024-05-21 DIAGNOSIS — S20362A Insect bite (nonvenomous) of left front wall of thorax, initial encounter: Secondary | ICD-10-CM | POA: Diagnosis not present

## 2024-05-21 NOTE — Discharge Instructions (Addendum)
 For itching around the tick bite you can use over-the-counter hydrocortisone ointment up to 2 times daily.  Please return to clinic if you develop fever, bull's-eye rash, or new concerning symptoms.

## 2024-05-21 NOTE — ED Triage Notes (Signed)
 Patient woke up with a tick on her left side. States she pulled it off but still noticed some black in that area. Patient wanting to be sure the full tic is gone. States there is some redness and pain around the area.    Patient has not taken any meds for her symptoms.

## 2024-05-21 NOTE — ED Provider Notes (Signed)
 [facility]    CSN: [id number] Arrival date & time: 05/21/24  0857      History   Chief Complaint Chief Complaint  Patient presents with   Tick Removal    HPI Michele Mills is a 48 y.o. female.   Patient presents to clinic over concern of a tick bite with potential retained head.  Noticed this morning she had a tick to the left side area.  She went to the store and got tweezers and removed it.  Felt like maybe some of the tick remains, it was difficult to remove herself due to area.  Tick was not engorged.  She does go hiking frequently with her 2 pounds and they sleep in the bed with her, thinks this may be how she got the tick.  Hike locally around [facility].  The history is provided by the patient and medical records.    Past Medical History:  Diagnosis Date   Seizure disorder [facility])     Patient Active Problem List   Diagnosis Date Noted   DUB (dysfunctional uterine bleeding) 09/17/2023   Symptomatic bradycardia 09/17/2023   Essential hypertension 12/13/2022   Colon cancer screening 12/13/2022   Encounter for general adult medical examination with abnormal findings 12/13/2022   ANA positive 04/29/2022   Rheumatoid factor positive 04/29/2022   Raynaud's disease without gangrene 04/23/2022   Seasonal allergic rhinitis due to pollen 03/01/2019   Overactive bladder 01/25/2018   Cysts of both ovaries 03/26/2017   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 No pertinent surgical history.  OB History   No obstetric history on file.      Home Medications    Prior to Admission medications   Medication Sig Start Date End Date Taking? Authorizing Provider  Cetirizine HCl (ZYRTEC ALLERGY PO) Take by mouth as needed.   Yes [provider]  nebivolol  (BYSTOLIC ) 5 MG tablet Take  1 tablet (5 mg total) by mouth daily. 05/18/24  Yes Joshua Debby CROME, MD  fluconazole  (DIFLUCAN ) 150 MG tablet Take 150 mg by mouth as needed. 04/22/23   [provider]  nystatin cream (MYCOSTATIN) Apply 1 Application topically 2 (two) times daily. 04/27/24   [provider]  triamcinolone  cream (KENALOG) 0.1 % Apply 1 Application topically 2 (two) times daily. 04/27/24   [provider]    Family History Family History  Problem Relation Age of Onset   Stroke Mother    Alcohol abuse Neg Hx    Cancer Neg Hx    Diabetes Neg Hx    Early death Neg Hx    Heart disease Neg Hx    Hyperlipidemia Neg Hx    Hypertension Neg Hx    Kidney disease Neg Hx     Social History Social History   Tobacco Use   Smoking status: Never   Smokeless tobacco: Never  Substance Use Topics   Alcohol use: No   Drug use: No     Allergies   Gonadotropin , Omeprazole , and Penicillins   Review of Systems Review of Systems  Per HPI  Physical Exam Triage Vital Signs ED Triage Vitals  Encounter Vitals Group     BP 05/21/24 0910 (!) 143/94     Girls Systolic BP Percentile --      Girls Diastolic BP Percentile --  Boys Systolic BP Percentile --      Boys Diastolic BP Percentile --      Pulse Rate 05/21/24 0910 87     Resp 05/21/24 0910 16     Temp 05/21/24 0910 98.3 F (36.8 C)     Temp Source 05/21/24 0910 Oral     SpO2 05/21/24 0910 99 %     Weight 05/21/24 0910 130 lb (59 kg)     Height 05/21/24 0910 5' 4 (1.626 m)     Head Circumference --      Peak Flow --      Pain Score 05/21/24 0909 2     Pain Loc --      Pain Education --      Exclude from Growth Chart --    No data found.  Updated Vital Signs BP (!) 143/94 (BP Location: Right Arm)   Pulse 87   Temp 98.3 F (36.8 C) (Oral)   Resp 16   Ht 5' 4 (1.626 m)   Wt 130 lb (59 kg)   LMP 04/26/2024 (Approximate)   SpO2 99%   BMI 22.31 kg/m   Visual Acuity Right Eye Distance:   Left Eye  Distance:   Bilateral Distance:    Right Eye Near:   Left Eye Near:    Bilateral Near:     Physical Exam Vitals and nursing note reviewed.  Constitutional:      Appearance: Normal appearance.  HENT:     Head: Normocephalic and atraumatic.     Right Ear: External ear normal.     Left Ear: External ear normal.     Nose: Nose normal.     Mouth/Throat:     Mouth: Mucous membranes are moist.  Eyes:     Conjunctiva/sclera: Conjunctivae normal.  Cardiovascular:     Rate and Rhythm: Normal rate.  Pulmonary:     Effort: Pulmonary effort is normal. No respiratory distress.  Skin:    General: Skin is warm and dry.         Comments: Tick bite site to the left lateral rib cage, around the left breast area.  Chaperone present.  Neurological:     General: No focal deficit present.     Mental Status: She is alert and oriented to person, place, and time.  Psychiatric:        Mood and Affect: Mood normal.        Behavior: Behavior normal. Behavior is cooperative.      UC Treatments / Results  Labs (all labs ordered are listed, but only abnormal results are displayed) Labs Reviewed - No data to display  EKG   Radiology No results found.  Procedures Procedures (including critical care time)  Medications Ordered in UC Medications - No data to display  Initial Impression / Assessment and Plan / UC Course  I have reviewed the triage vital signs and the nursing notes.  Pertinent labs & imaging results that were available during my care of the patient were reviewed by me and considered in my medical decision making (see chart for details).  Vitals and triage reviewed, patient is hemodynamically stable.  Take bite area to the left lateral rib cage/breast area.  Chaperone present.  Small black area centrally, could be the head.  Area cleansed with alcohol, tick head removed with tweezers.  Discussed patient is low risk for Lyme disease.  Tick was not attached for over 38 hours,  not a high risk area for Lyme disease.  Will defer empiric treatment.  Encouraged topical hydrocortisone ointment as needed for itching.  Plan of care, follow-up care return precautions given, no questions at this time.     Final Clinical Impressions(s) / UC Diagnoses   Final diagnoses:  Tick bite with subsequent removal of tick     Discharge Instructions      For itching around the tick bite you can use over-the-counter hydrocortisone ointment up to 2 times daily.  Please return to clinic if you develop fever, bull's-eye rash, or new concerning symptoms.     ED Prescriptions   None    PDMP not reviewed this encounter.   Dreama Kary SAILOR, [place] 05/21/24 [phone]
